# Patient Record
Sex: Male | Born: 1975 | Race: White | Hispanic: No | Marital: Married | Smoking: Never smoker
Health system: Southern US, Community
[De-identification: ages and names within clinical notes are randomized; demographics above are authoritative.]

## PROBLEM LIST (undated history)

## (undated) DIAGNOSIS — B001 Herpesviral vesicular dermatitis: Secondary | ICD-10-CM

## (undated) DIAGNOSIS — H9319 Tinnitus, unspecified ear: Secondary | ICD-10-CM

## (undated) DIAGNOSIS — I471 Supraventricular tachycardia, unspecified: Secondary | ICD-10-CM

## (undated) DIAGNOSIS — M40299 Other kyphosis, site unspecified: Secondary | ICD-10-CM

## (undated) DIAGNOSIS — J45909 Unspecified asthma, uncomplicated: Secondary | ICD-10-CM

## (undated) DIAGNOSIS — R011 Cardiac murmur, unspecified: Secondary | ICD-10-CM

## (undated) DIAGNOSIS — H4911 Fourth [trochlear] nerve palsy, right eye: Secondary | ICD-10-CM

## (undated) DIAGNOSIS — T783XXA Angioneurotic edema, initial encounter: Secondary | ICD-10-CM

## (undated) HISTORY — DX: Angioneurotic edema, initial encounter: T78.3XXA

## (undated) HISTORY — DX: Other kyphosis, site unspecified: M40.299

## (undated) HISTORY — DX: Tinnitus, unspecified ear: H93.19

## (undated) HISTORY — DX: Supraventricular tachycardia, unspecified: I47.10

## (undated) HISTORY — DX: Fourth (trochlear) nerve palsy, right eye: H49.11

## (undated) HISTORY — DX: Supraventricular tachycardia: I47.1

## (undated) HISTORY — DX: Herpesviral vesicular dermatitis: B00.1

## (undated) HISTORY — DX: Unspecified asthma, uncomplicated: J45.909

## (undated) HISTORY — DX: Cardiac murmur, unspecified: R01.1

## (undated) HISTORY — PX: MOHS SURGERY: SUR867

---

## 1999-07-17 ENCOUNTER — Encounter: Admission: RE | Admit: 1999-07-17 | Discharge: 1999-08-16 | Payer: Self-pay | Admitting: Family Medicine

## 2004-05-23 ENCOUNTER — Ambulatory Visit: Payer: Self-pay | Admitting: Family Medicine

## 2004-05-26 ENCOUNTER — Ambulatory Visit: Payer: Self-pay | Admitting: Family Medicine

## 2006-04-29 ENCOUNTER — Ambulatory Visit: Payer: Self-pay | Admitting: Family Medicine

## 2006-08-28 DIAGNOSIS — J45909 Unspecified asthma, uncomplicated: Secondary | ICD-10-CM | POA: Insufficient documentation

## 2007-05-06 ENCOUNTER — Ambulatory Visit: Payer: Self-pay | Admitting: Family Medicine

## 2007-06-09 ENCOUNTER — Ambulatory Visit: Payer: Self-pay | Admitting: Family Medicine

## 2007-12-03 ENCOUNTER — Ambulatory Visit: Payer: Self-pay | Admitting: Family Medicine

## 2007-12-03 DIAGNOSIS — M40299 Other kyphosis, site unspecified: Secondary | ICD-10-CM

## 2007-12-03 DIAGNOSIS — M40204 Unspecified kyphosis, thoracic region: Secondary | ICD-10-CM | POA: Insufficient documentation

## 2007-12-03 LAB — CONVERTED CEMR LAB
ALT: 21 units/L (ref 0–53)
AST: 21 units/L (ref 0–37)
Albumin: 4.5 g/dL (ref 3.5–5.2)
BUN: 11 mg/dL (ref 6–23)
Basophils Absolute: 0 10*3/uL (ref 0.0–0.1)
Basophils Relative: 0.2 % (ref 0.0–1.0)
CO2: 29 meq/L (ref 19–32)
Calcium: 9.6 mg/dL (ref 8.4–10.5)
Chloride: 109 meq/L (ref 96–112)
Creatinine, Ser: 0.8 mg/dL (ref 0.4–1.5)
Eosinophils Relative: 3.5 % (ref 0.0–5.0)
Glucose, Bld: 92 mg/dL (ref 70–99)
Hemoglobin: 16.6 g/dL (ref 13.0–17.0)
Lymphocytes Relative: 36.1 % (ref 12.0–46.0)
MCHC: 34.2 g/dL (ref 30.0–36.0)
Neutro Abs: 2 10*3/uL (ref 1.4–7.7)
RBC: 5.38 M/uL (ref 4.22–5.81)
TSH: 1.05 microintl units/mL (ref 0.35–5.50)
Total Bilirubin: 1.3 mg/dL — ABNORMAL HIGH (ref 0.3–1.2)
Total Protein: 7 g/dL (ref 6.0–8.3)
WBC: 3.9 10*3/uL — ABNORMAL LOW (ref 4.5–10.5)

## 2007-12-12 ENCOUNTER — Encounter: Payer: Self-pay | Admitting: Family Medicine

## 2008-10-23 ENCOUNTER — Ambulatory Visit: Payer: Self-pay | Admitting: Family Medicine

## 2008-10-26 ENCOUNTER — Ambulatory Visit: Payer: Self-pay | Admitting: Family Medicine

## 2008-12-03 ENCOUNTER — Ambulatory Visit: Payer: Self-pay | Admitting: Family Medicine

## 2008-12-03 LAB — CONVERTED CEMR LAB
ALT: 17 units/L (ref 0–53)
AST: 17 units/L (ref 0–37)
Albumin: 4.1 g/dL (ref 3.5–5.2)
BUN: 11 mg/dL (ref 6–23)
Basophils Absolute: 0 10*3/uL (ref 0.0–0.1)
CO2: 30 meq/L (ref 19–32)
Chloride: 110 meq/L (ref 96–112)
Cholesterol: 159 mg/dL (ref 0–200)
GFR calc non Af Amer: 137.96 mL/min (ref >60)
Glucose, Bld: 76 mg/dL (ref 70–99)
HCT: 46.2 % (ref 39.0–52.0)
Hemoglobin: 16 g/dL (ref 13.0–17.0)
Lymphs Abs: 1.6 10*3/uL (ref 0.7–4.0)
MCV: 89 fL (ref 78.0–100.0)
Monocytes Absolute: 0.5 10*3/uL (ref 0.1–1.0)
Monocytes Relative: 10.6 % (ref 3.0–12.0)
Neutro Abs: 2.1 10*3/uL (ref 1.4–7.7)
Platelets: 182 10*3/uL (ref 150.0–400.0)
Potassium: 4 meq/L (ref 3.5–5.1)
RDW: 12.5 % (ref 11.5–14.6)
Sodium: 145 meq/L (ref 135–145)
TSH: 1.01 microintl units/mL (ref 0.35–5.50)
Total Bilirubin: 0.8 mg/dL (ref 0.3–1.2)

## 2008-12-09 ENCOUNTER — Ambulatory Visit: Payer: Self-pay | Admitting: Family Medicine

## 2009-01-17 ENCOUNTER — Ambulatory Visit: Payer: Self-pay | Admitting: Family Medicine

## 2009-01-18 ENCOUNTER — Encounter (INDEPENDENT_AMBULATORY_CARE_PROVIDER_SITE_OTHER): Payer: Self-pay | Admitting: *Deleted

## 2009-01-18 LAB — CONVERTED CEMR LAB: Herpes Simplex Vrs I&II-IgM Ab (EIA): 0.27

## 2009-03-10 ENCOUNTER — Telehealth: Payer: Self-pay | Admitting: Family Medicine

## 2009-03-28 ENCOUNTER — Ambulatory Visit: Payer: Self-pay | Admitting: Family Medicine

## 2009-04-13 ENCOUNTER — Encounter: Payer: Self-pay | Admitting: Family Medicine

## 2010-01-05 ENCOUNTER — Encounter (INDEPENDENT_AMBULATORY_CARE_PROVIDER_SITE_OTHER): Payer: Self-pay | Admitting: *Deleted

## 2010-02-02 ENCOUNTER — Encounter: Payer: Self-pay | Admitting: Family Medicine

## 2010-02-02 ENCOUNTER — Ambulatory Visit: Payer: Self-pay | Admitting: Internal Medicine

## 2010-02-02 DIAGNOSIS — D2920 Benign neoplasm of unspecified testis: Secondary | ICD-10-CM | POA: Insufficient documentation

## 2010-02-02 DIAGNOSIS — D239 Other benign neoplasm of skin, unspecified: Secondary | ICD-10-CM | POA: Insufficient documentation

## 2010-03-06 ENCOUNTER — Encounter: Payer: Self-pay | Admitting: Family Medicine

## 2010-03-06 ENCOUNTER — Ambulatory Visit: Payer: Self-pay | Admitting: Internal Medicine

## 2010-04-25 ENCOUNTER — Ambulatory Visit: Payer: Self-pay | Admitting: Internal Medicine

## 2010-04-25 DIAGNOSIS — H9319 Tinnitus, unspecified ear: Secondary | ICD-10-CM | POA: Insufficient documentation

## 2010-07-06 NOTE — Assessment & Plan Note (Signed)
Summary: Benjamin Norman   Vital Signs:  Patient profile:   35 year old male Height:      70.50 inches Weight:      179.25 pounds BMI:     25.45 Temp:     98.6 degrees F oral Pulse rate:   64 / minute Pulse rhythm:   regular BP sitting:   126 / 80  (left arm) Cuff size:   large  Vitals Entered By: Selena Batten Dance CMA (AAMA) (February 02, 2010 8:32 AM) [name]   History of Present Illness: CC: CPE and issues  1. mole - per wife changing, dark center getting larger.  present for long time.  No bleeding, itching, pain.  Flat.  pt doesn't feel it.  2. left testicle - told just gland.  Has been there for >1 year.    preventative - UTD tetanus.  will check with wife about flu today.  Preventive Screening-Counseling & Management  Alcohol-Tobacco     Alcohol drinks/day: <1     Smoking Status: never  Caffeine-Diet-Exercise     Caffeine use/day: 1 can soda/2 days      Drug Use:  never.        Blood Transfusions:  no.    Current Medications (verified): 1)  Claritin 10 Mg Tabs (Loratadine) .Marland Kitchen.. 1 Daily By Mouth  Allergies: 1)  ! * Theodur  Past History:  Past Medical History: Asthma as child last inhaler use 99 swelling of lips intermittently, fever blisters  Social History: No smoking, EtOH, rec drugs Marital Status: Married Children: 1 DAUGHER (Benjamin Norman)  6 yoa, 1 SON (Benjamin Norman)  Youth Education officer, environmental at [employer], Occupation: Nature conservation officer M-F Regular exercise-yes Caffeine use/day:  1 can soda/2 days Blood Transfusions:  no Drug Use:  never  Review of Systems       The patient complains of hoarseness and suspicious skin lesions.  The patient denies anorexia, fever, weight loss, weight gain, vision loss, decreased hearing, chest pain, syncope, dyspnea on exertion, peripheral edema, prolonged cough, headaches, hemoptysis, abdominal pain, melena, hematochezia, severe indigestion/heartburn, hematuria, incontinence, transient blindness, difficulty walking, and depression.         NO  N/V/D/C  Physical Exam  General:  Well-developed,well-nourished,in no acute distress; alert,appropriate and cooperative throughout examination Head:  Normocephalic and atraumatic without obvious abnormalities. No apparent alopecia or balding. Eyes:  Conjunctiva clear bilaterally.  Ears:  External ear exam shows no significant lesions or deformities.  Otoscopic examination reveals clear canals, tympanic membranes are intact bilaterally without bulging, retraction, inflammation or discharge. Hearing is grossly normal bilaterally. Nose:  External nasal examination shows no deformity or inflammation. Nasal mucosa are pink and moist without lesions or exudates. Mouth:  Oral mucosa and oropharynx without lesions or exudates.  Teeth in good repair. Neck:  No deformities, masses, or tenderness noted. Lungs:  Normal respiratory effort, chest expands symmetrically. Lungs are clear to auscultation, no crackles or wheezes. Heart:  Normal rate and regular rhythm. S1 and S2 normal without gallop, murmur, click, rub or other extra sounds. Abdomen:  Bowel sounds positive,abdomen soft and non-tender without masses, organomegaly or hernias noted. Genitalia:  Testes bilaterally descended without nodularity, tenderness or masses. No scrotal masses or lesions except mild left varicocele. No penis lesions or urethral discharge.  + small benign-feeling calcification on surface of left testis Msk:  No deformity or scoliosis noted of thoracic or lumbar spine.   Pulses:  2+ rad pulses Extremities:  No clubbing, cyanosis, edema, or deformity noted with normal full  range of motion of all joints.   Skin:  left post shoulder with  ~1cm fleshy colored dome shaped mole but central area of irregular hyperpigmentation. left back medial to scapula  ~35mm mole with irregular border, different shades of brown left upper back with similar mole lateral to scapula  ~27mm in size. Psych:  Cognition and judgment appear intact. Alert and  cooperative with normal attention span and concentration. No apparent delusions, illusions, hallucinations   Impression & Recommendations:  Problem # 1:  HEALTH MAINTENANCE EXAM (ICD-V70.0)  Reviewed preventive care protocols, scheduled due services, and updated immunizations.  UTD tetanus.  declines flu today.    Problem # 2:  NEVUS, ATYPICAL (ICD-216.9)  removed x 2.  first per pt changing, 2nd with irregular borders.  sent to path.  Orders: Shave Skin Lesion 0.6-1.0 cm/trunk/arm/leg (11301)  Problem # 3:  HYPERCHOLESTEROLEMIA (ICD-272.0) actually well controlled will remove from problem list, rpt 2015. 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  rec continue monitoring, if changing will need [place].  Complete Medication List: 1)  Claritin 10 Mg Tabs (Loratadine) .Marland Kitchen.. 1 daily by mouth  Patient Instructions: 1)  Pleasure to meet you today. 2)  We will call you with results of biopys - sent to pathology.  If you haven't heard from [place] in 2 weeks, please call us. 3)  Call clinic with questions. Return in 1 year for CPE.  Current Allergies (reviewed today): ! * THEODUR   Procedure Note Last Tetanus: Tdap (06/09/2007)  Mole Biopsy/Removal:  Procedure # 1: shave biopsy    Size (in cm): 1.0 x 1.0    Region: posterior    Location: back-upper-left    Comment: posterior shoulder    Anesthesia: 1% lidocaine w/epinephrine  Procedure # 2: shave biopsy    Size (in cm): 0.6 x 0.8    Region: posterior    Location: back-mid-left    Comment: medial to scapula    Anesthesia: 1% lidocaine w/epinephrine  Additional Instructions: IC obtained and in chart.  site prepped with EtOH, anesthesia achieved, bleeding stopped with silver nitrate.  pt tolerated procedure well.   Prevention  & Chronic Care Immunizations   Influenza vaccine: Fluvax 3+  (04/01/2009)   Influenza vaccine deferral: Refused  (02/02/2010)    Tetanus booster: 06/09/2007: Tdap   Tetanus booster due: 06/08/2017    Pneumococcal vaccine: Not documented  Other Screening   Smoking status: never  (02/02/2010)

## 2010-07-06 NOTE — Miscellaneous (Signed)
Summary: Consent to Special Procedure  Consent to Special Procedure   Imported By: Lanelle Bal 03/13/2010 09:28:09  _____________________________________________________________________  External Attachment:    Type:   Image     Comment:   External Document

## 2010-07-06 NOTE — Miscellaneous (Signed)
Summary: Consent to Special Procedure  Consent to Special Procedure   Imported By: Lanelle Bal 02/10/2010 13:50:11  _____________________________________________________________________  External Attachment:    Type:   Image     Comment:   External Document

## 2010-07-06 NOTE — Assessment & Plan Note (Signed)
Summary: Back pain , headaches /lsf   Vital Signs:  Patient profile:   35 year old male Height:      70.50 inches Weight:      186.25 pounds BMI:     26.44 Temp:     98.8 degrees F oral Pulse rate:   84 / minute Pulse rhythm:   regular BP sitting:   120 / 82  (left arm) Cuff size:   large  Vitals Entered By: Selena Batten Dance CMA Duncan Dull) (April 25, 2010 4:18 PM) CC: ear is ringing, headaches, back pain   History of Present Illness: CC: back pain, ringing ears  yesterday went to get out of chair at work, felt sharp stabbing backache.  Today when awoke had pain all over.  Worse with looking down - feels pull in back when looking down.  Pain located mid back.    No fevers/chills, no injury to back recently.  No bowel or bladder accidents.  No saddle anesthesia.  No shooting pain down legs.  + paresthesias R foot yesterday x a few minutes not anymore.  2wk h/o R ear ringing, more frequent headaches.  Start as sinus headache then turn into migraine.  Taking mucinex/pseudophed.  h/o allergies, h/o sinus infections in past.  currently on claritin, has been on for 1 1/2 years.  previoulsy on allegra.  No hearing changes or draining out of ears.  + using qtip in ears.  No dizziness, vision changes.  + mild congestion/RN.  No sick contacts at home.  Current Medications (verified): 1)  Claritin 10 Mg Tabs (Loratadine) .Marland Kitchen.. 1 Daily By Mouth  Allergies: 1)  ! * Theodur  Past History:  Past Medical History: Last updated: 02/02/2010 Asthma as child last inhaler use 99 swelling of lips intermittently, fever blisters  Social History: Last updated: 02/02/2010 No smoking, EtOH, rec drugs Marital Status: Married Children: 1 DAUGHER (Faith)  6 yoa, 1 SON [name])  Youth Education officer, environmental at [employer], Occupation: Nature conservation officer M-F Regular exercise-yes  Review of Systems       per HPI  Physical Exam  General:  Well-developed,well-nourished,in no acute distress; alert,appropriate and cooperative  throughout examination Head:  Normocephalic and atraumatic without obvious abnormalities. No apparent alopecia or balding.  NT sinuses Eyes:  Conjunctiva clear bilaterally. PERRLA, EOMI Ears:  some fluid behind TMs Nose:  congested Mouth:  Oral mucosa and oropharynx without lesions or exudates.  Teeth in good repair. Neck:  No deformities, masses, or tenderness noted.  FROM Lungs:  Normal respiratory effort, chest expands symmetrically. Lungs are clear to auscultation, no crackles or wheezes. Heart:  Normal rate and regular rhythm. S1 and S2 normal without gallop, murmur, click, rub or other extra sounds. Msk:  No deformity or scoliosis noted of thoracic or lumbar spine.  no midline tenderness.  no paraspinous mm tenderness/tightness.  Negative SLR test bilaterally.  FROM at spine.  + pain with leaning backward onto exam table. Pulses:  2+ rad pulses Extremities:  no pedal edema Neurologic:  sensation/sterngth grossly intact   Impression & Recommendations:  Problem # 1:  BACK PAIN (ICD-724.5) no red flags.  likely muscle spasm, treat with flexeril, ice.  rec NSAID as well.  to return if worsening for further eval.  if not better in 1-2 wks, to call for PT.  h/o thoracic kyphosis  His updated medication list for this problem includes:    Flexeril 5 Mg Tabs (Cyclobenzaprine hcl) ..... One by mouth two times a day as needed muscle spasm  Problem # 2:  TINNITUS, RIGHT (ICD-388.30) normal exam today.  reassured.  ? allergies.  consider changing to allegra.  also consider nasal saline spray.  if residual, return for further evaluation.  Complete Medication List: 1)  Claritin 10 Mg Tabs (Loratadine) .Marland Kitchen.. 1 daily by mouth 2)  Flexeril 5 Mg Tabs (Cyclobenzaprine hcl) .... One by mouth two times a day as needed muscle spasm  Patient Instructions: 1)  muscle spasm - treat with anti inflammatory and muscle relaxant (can make you sleepy so watch out.) 2)  ringing in ears - could be allergies -  watch for now.  consider changing antihistamine to allegra or zyrtec. 3)  Call clinic with questions.  Good to see you today. 4)  let us know if not helping, may send you to PT. 5)  Return sooner of worsening. Prescriptions: FLEXERIL 5 MG TABS (CYCLOBENZAPRINE HCL) one by mouth two times a day as needed muscle spasm  #30 x 0   Entered and Authorized by:   [name]  MD   Signed by:   [name]  MD on 04/25/2010   Method used:   Electronically to        [facility] (retail)       [address]       [place], [place]  [address]       Ph: [phone]       Fax: [phone]   RxID:   [id number]    Orders Added: 1)  Est. Patient Level III [23762]    Current Allergies (reviewed today): ! * THEODUR

## 2010-07-06 NOTE — Assessment & Plan Note (Signed)
Summary: Return for 2nd bx(check 3rd place)//kad   Vital Signs:  Patient profile:   35 year old male Weight:      183 pounds Temp:     98.2 degrees F oral Pulse rate:   84 / minute Pulse rhythm:   regular BP sitting:   118 / 80  (left arm) Cuff size:   large  Vitals Entered By: Selena Batten Dance CMA Duncan Dull) (March 06, 2010 8:28 AM) CC: Bx   History of Present Illness: CC: f/u biopsy, rebiopsy site  Last visit 2 moles biopsied.  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    returns today for rpt biopsy of 2nd mole and to check third one.  Allergies: 1)  ! * Theodur  Past History:  Past Medical History: Last updated: 02/02/2010 Asthma as child last inhaler use 99 swelling of lips intermittently, fever blisters  Social History: Last updated: 02/02/2010 No smoking, EtOH, rec drugs Marital Status: Married Children: 1 DAUGHER (Faith)  6 yoa, 1 SON [name])  Youth Education officer, environmental at [employer], Occupation: Nature conservation officer M-F Regular exercise-yes  Family History: Reviewed history from 12/09/2008 and no changes required. Father: A 56 Mother: A 54 HTN Chol Brother A 37   Review of Systems       per HPI  Physical Exam  General:  Well-developed,well-nourished,in no acute distress; alert,appropriate and cooperative throughout examination Skin:  Left post shoulder - healing scar L upper back medial to shoulder blade - healing scar L upper back lateral to shoulder blade - 7mm mole with irreg border, different shades of brown. Additional Exam:  IC obtained and in chart - re-biopsy of mole L medial to scapula.  anesthesia with lido w/ epi, hemostasis achieved with silver nitrate.  dressed w/ abx ointment and bandaid.  pt tolerated procedure well.   Impression & Recommendations:  Problem # 1:  NEVUS, ATYPICAL (ICD-216.9) nevus with mild atypia.  re-biopsy given areas  extended to one edge.  discussed process with patient as well as path findings.  rec pt see dermatologist for skin exam in next few years.  Complete Medication List: 1)  Claritin 10 Mg Tabs (Loratadine) .Marland Kitchen.. 1 daily by mouth  Current Allergies (reviewed today): ! * THEODUR  Appended Document: Return for 2nd bx(check 3rd place)//kad given 3rd spot looks like 2nd spot, and 2nd mole removed returned only with mild atypia, recommended watchful waiting for 3rd spot lateral to L scapula, to return if mole changing any.

## 2010-07-06 NOTE — Letter (Signed)
Summary: Nadara Eaton letter  [place] [facility]  [address] [place], [place] [address]   Phone: [phone]  Fax: [phone]       01/05/2010 MRN: [id number]  CALIN FANTROY [address] [place], [place]  [address]  Dear Mr. [facility] [place] [place] announce the retirement of Arta Silence, M.D., from full-time practice at the [facility] office effective December 01, 2009 and his plans of returning part-time.  It is important to Dr. Hetty Ely and to our practice that you understand that [facility] has seven physicians in our office for your health care needs.  We will continue to offer the same exceptional care that you have today.    Dr. Hetty Ely has spoken to many of you about his plans for retirement and returning part-time in the fall.   We will continue to work with you through the transition to schedule appointments for you in the office and meet the high standards that [place] is committed to.   Again, it is with great pleasure that we share the news that Dr. Hetty Ely will return to [facility] in October of 2011 with a reduced schedule.    If you have any questions, or would like to request an appointment with one of our physicians, please call us at [phone] and press the option for Scheduling an appointment.  We take pleasure in providing you with excellent patient care and look forward to seeing you at your next office visit.  Our [facility] Physicians are:  Tillman Abide, M.D. Laurita Quint, M.D. Roxy Manns, M.D. Kerby Nora, M.D. [name], M.D. Ruthe Mannan, M.D. We proudly welcomed Raechel Ache, M.D. and [name], M.D. to the practice in July/August 2011.  Sincerely,  [facility]

## 2011-02-08 ENCOUNTER — Encounter: Payer: Self-pay | Admitting: Family Medicine

## 2011-02-09 ENCOUNTER — Ambulatory Visit (INDEPENDENT_AMBULATORY_CARE_PROVIDER_SITE_OTHER): Payer: PRIVATE HEALTH INSURANCE | Admitting: Family Medicine

## 2011-02-09 ENCOUNTER — Encounter: Payer: Self-pay | Admitting: Family Medicine

## 2011-02-09 ENCOUNTER — Other Ambulatory Visit: Payer: Self-pay | Admitting: Family Medicine

## 2011-02-09 VITALS — BP 110/70 | HR 80 | Temp 98.4°F | Ht 71.0 in | Wt 189.2 lb

## 2011-02-09 DIAGNOSIS — Z0000 Encounter for general adult medical examination without abnormal findings: Secondary | ICD-10-CM | POA: Insufficient documentation

## 2011-02-09 DIAGNOSIS — Z0001 Encounter for general adult medical examination with abnormal findings: Secondary | ICD-10-CM | POA: Insufficient documentation

## 2011-02-09 DIAGNOSIS — R5383 Other fatigue: Secondary | ICD-10-CM | POA: Insufficient documentation

## 2011-02-09 DIAGNOSIS — R5381 Other malaise: Secondary | ICD-10-CM

## 2011-02-09 LAB — CBC WITH DIFFERENTIAL/PLATELET
Basophils Absolute: 0 10*3/uL (ref 0.0–0.1)
Basophils Relative: 0.5 % (ref 0.0–3.0)
Eosinophils Absolute: 0.2 10*3/uL (ref 0.0–0.7)
Eosinophils Relative: 3.4 % (ref 0.0–5.0)
HCT: 51.6 % (ref 39.0–52.0)
Hemoglobin: 17.2 g/dL — ABNORMAL HIGH (ref 13.0–17.0)
Lymphocytes Relative: 27.2 % (ref 12.0–46.0)
Lymphs Abs: 1.5 10*3/uL (ref 0.7–4.0)
MCHC: 33.4 g/dL (ref 30.0–36.0)
MCV: 89.8 fl (ref 78.0–100.0)
Monocytes Absolute: 0.4 10*3/uL (ref 0.1–1.0)
Monocytes Relative: 7.2 % (ref 3.0–12.0)
Neutro Abs: 3.5 10*3/uL (ref 1.4–7.7)
Neutrophils Relative %: 61.7 % (ref 43.0–77.0)
Platelets: 180 10*3/uL (ref 150.0–400.0)
RBC: 5.74 Mil/uL (ref 4.22–5.81)
RDW: 12.9 % (ref 11.5–14.6)
WBC: 5.6 10*3/uL (ref 4.5–10.5)

## 2011-02-09 LAB — COMPREHENSIVE METABOLIC PANEL
Albumin: 4.7 g/dL (ref 3.5–5.2)
BUN: 8 mg/dL (ref 6–23)
CO2: 29 mEq/L (ref 19–32)
GFR: 131.82 mL/min (ref >60.00)
Glucose, Bld: 91 mg/dL (ref 70–99)
Potassium: 4.5 mEq/L (ref 3.5–5.1)
Sodium: 141 mEq/L (ref 135–145)
Total Bilirubin: 1.3 mg/dL — ABNORMAL HIGH (ref 0.3–1.2)
Total Protein: 7.8 g/dL (ref 6.0–8.3)

## 2011-02-09 LAB — TSH: TSH: 0.81 u[IU]/mL (ref 0.35–5.50)

## 2011-02-09 LAB — VITAMIN B12: Vitamin B-12: 362 pg/mL (ref 211–911)

## 2011-02-09 NOTE — Progress Notes (Signed)
  Subjective:    Patient ID: Benjamin Norman, male    DOB: March 01, 1976, 35 y.o.   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  Controls with steroid cream as well as aquaphor.  Increased fatigue recently for last several months.  Started increasing activity and taking vitamin pill, this has helped.  Currently in school.  No dizziness, SOB.  Has had skin biopsies done in past, 12/2009 with dysplastic nevus with melanocytic atypia.  Preventative: Td 2009.  Review of Systems  Constitutional: Negative for fever, chills, activity change, appetite change, fatigue and unexpected weight change.  HENT: Negative for hearing loss and neck pain.   Eyes: Negative for visual disturbance.  Respiratory: Negative for cough, chest tightness, shortness of breath and wheezing.   Cardiovascular: Negative for chest pain, palpitations and leg swelling.  Gastrointestinal: Negative for nausea, vomiting, abdominal pain, diarrhea, constipation, blood in stool and abdominal distention.  Genitourinary: Negative for hematuria and difficulty urinating.  Musculoskeletal: Negative for myalgias and arthralgias.  Skin: Negative for rash.  Neurological: Negative for dizziness, seizures, syncope and headaches.  Hematological: Does not bruise/bleed easily.  Psychiatric/Behavioral: Negative for dysphoric mood. The patient is not nervous/anxious.        Objective:   Physical Exam  Nursing note and vitals reviewed. Constitutional: He is oriented to person, place, and time. He appears well-developed and well-nourished. No distress.  HENT:  Head: Normocephalic and atraumatic.  Right Ear: External ear normal.  Left Ear: External ear normal.  Nose: Nose normal.  Mouth/Throat: Oropharynx is clear and moist.  Eyes: Conjunctivae and EOM are normal. Pupils are equal, round, and reactive to light.  Neck: Normal range of motion. Neck supple. No thyromegaly present.  Cardiovascular:  Normal rate, regular rhythm, normal heart sounds and intact distal pulses.   No murmur heard. Pulses:      Radial pulses are 2+ on the right side, and 2+ on the left side.  Pulmonary/Chest: Effort normal and breath sounds normal. No respiratory distress. He has no wheezes. He has no rales.  Abdominal: Soft. Bowel sounds are normal. He exhibits no distension and no mass. There is no tenderness. There is no rebound and no guarding.  Musculoskeletal: Normal range of motion.  Lymphadenopathy:    He has no cervical adenopathy.  Neurological: He is alert and oriented to person, place, and time.       CN grossly intact, station and gait intact  Skin: Skin is warm and dry. No rash noted.       Multiple nevi throughout skin  Psychiatric: He has a normal mood and affect. His behavior is normal. Judgment and thought content normal.          Assessment & Plan:

## 2011-02-09 NOTE — Patient Instructions (Addendum)
Good to see you today, things are looking well. Blood work today. We will call you with blood work results. Return as needed or in 1-2 years for repeat physical. Look into dermatology for skin check.

## 2011-02-09 NOTE — Assessment & Plan Note (Signed)
Preventative protocols reviewed and updated unless pt declined. Pt declined flu shot. Pt interested in derm eval for skin check.  Recommended set this up.

## 2011-02-09 NOTE — Assessment & Plan Note (Signed)
Sounds like due to lack of activity/exercise as improving with recent increased exercise. Check blood work to r/o other causes of fatigue.

## 2011-05-15 ENCOUNTER — Telehealth: Payer: Self-pay | Admitting: *Deleted

## 2011-05-15 NOTE — Telephone Encounter (Signed)
Spoke to patient's wife and was informed that patient has not had any recent head injury. Patient's wife was informed as instructed.

## 2011-05-15 NOTE — Telephone Encounter (Signed)
Patient says he walked for exercise at lunch time from work and suddenly a flourescent colored yellow, watery discharge starting pouring from his nose, actually accumulating on the sidewalk.  It then got better but now any time he bends over, it starts again.  He says he doesn't feel good and thinks he may be starting a sinus infection.  Does he need an appt or give it longer?  He has had a lot of sinus infections in the past but never had this to happen before, especially the odd-colored fluid.

## 2011-05-15 NOTE — Telephone Encounter (Signed)
Any recent head injury?  If not, would give more time.  May come in for eval if not improving with time.

## 2011-05-25 NOTE — Telephone Encounter (Signed)
Opened in error

## 2011-08-10 ENCOUNTER — Other Ambulatory Visit (INDEPENDENT_AMBULATORY_CARE_PROVIDER_SITE_OTHER): Payer: PRIVATE HEALTH INSURANCE

## 2011-08-10 DIAGNOSIS — R7402 Elevation of levels of lactic acid dehydrogenase (LDH): Secondary | ICD-10-CM

## 2011-08-10 LAB — HEPATIC FUNCTION PANEL
Alkaline Phosphatase: 51 U/L (ref 39–117)
Bilirubin, Direct: 0.1 mg/dL (ref 0.0–0.3)
Total Bilirubin: 0.8 mg/dL (ref 0.3–1.2)

## 2012-04-22 ENCOUNTER — Other Ambulatory Visit: Payer: Self-pay | Admitting: Dermatology

## 2012-08-01 ENCOUNTER — Other Ambulatory Visit: Payer: Self-pay | Admitting: Family Medicine

## 2012-08-05 ENCOUNTER — Other Ambulatory Visit: Payer: PRIVATE HEALTH INSURANCE

## 2012-08-07 ENCOUNTER — Other Ambulatory Visit (INDEPENDENT_AMBULATORY_CARE_PROVIDER_SITE_OTHER): Payer: BC Managed Care – PPO

## 2012-08-07 LAB — COMPREHENSIVE METABOLIC PANEL
Alkaline Phosphatase: 55 U/L (ref 39–117)
Glucose, Bld: 82 mg/dL (ref 70–99)
Sodium: 139 mEq/L (ref 135–145)
Total Bilirubin: 1.9 mg/dL — ABNORMAL HIGH (ref 0.3–1.2)
Total Protein: 7.1 g/dL (ref 6.0–8.3)

## 2012-08-07 LAB — TSH: TSH: 1.32 u[IU]/mL (ref 0.35–5.50)

## 2012-08-07 NOTE — Addendum Note (Signed)
Addended by: Alvina Chou on: 08/07/2012 08:25 AM   Modules accepted: Orders

## 2012-08-12 ENCOUNTER — Ambulatory Visit (INDEPENDENT_AMBULATORY_CARE_PROVIDER_SITE_OTHER): Payer: BC Managed Care – PPO | Admitting: Family Medicine

## 2012-08-12 ENCOUNTER — Encounter: Payer: Self-pay | Admitting: Family Medicine

## 2012-08-12 VITALS — BP 118/74 | HR 68 | Temp 98.3°F | Ht 70.0 in

## 2012-08-12 NOTE — Progress Notes (Signed)
Subjective:    Patient ID: Benjamin Norman, male    DOB: Sep 03, 1975, 37 y.o.   MRN: [id number]  HPI CC: CPE  No concerns today Allergic rhinitis - intermittently alternates claritin with allegra.  Sees dermatologist yearly.  H/o atypical nevi.  Trying to eat healthier.  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  Past histories reviewed and updated if relevant as below. Patient Active Problem List  Diagnosis  . NEVUS, ATYPICAL  . TINNITUS, RIGHT  . ASTHMA  . THORACIC KYPHOSIS  . Fatigue  . Healthcare maintenance  . Transaminitis   Past Medical History  Diagnosis Date  . Childhood asthma   . 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 Fever blister   . Other kyphosis (acquired)     thoracic  . Unspecified tinnitus    History reviewed. No pertinent past surgical history. History  Substance Use Topics  . Smoking status: Never Smoker   . Smokeless tobacco: Never Used  . Alcohol Use: No   Family History  Problem Relation Age of Onset  . Hypertension Mother   . Hyperlipidemia Mother   . Healthy Father   . Healthy Brother   . Coronary artery disease Paternal Grandfather   . Alcohol abuse Paternal Grandfather   . Cancer Neg Hx   . Stroke Neg Hx   . Diabetes Neg Hx    Allergies  Allergen Reactions  . Theophyllines Nausea And Vomiting    As teen   Current Outpatient Prescriptions on File Prior to Visit  Medication Sig Dispense Refill  . mineral oil-hydrophilic petrolatum (AQUAPHOR) ointment Apply 1 application topically as  needed.         No current facility-administered medications on file prior to visit.    Review of Systems  Constitutional: Negative for fever, chills, activity change, appetite change, fatigue and unexpected weight change.  HENT: Negative for hearing loss and neck pain.   Eyes: Negative for visual disturbance.  Respiratory: Negative for cough, chest tightness, shortness of breath and wheezing.   Cardiovascular: Negative for chest pain, palpitations and leg swelling.  Gastrointestinal: Negative for nausea, vomiting, abdominal pain, diarrhea, constipation, blood in stool and abdominal distention.  Genitourinary: Negative for hematuria and difficulty urinating.  Musculoskeletal: Negative for myalgias and arthralgias.  Skin: Negative for rash.  Neurological: Positive for headaches. Negative for dizziness, seizures and syncope.  Hematological: Does not bruise/bleed easily.  Psychiatric/Behavioral: Negative for dysphoric mood. The patient is not nervous/anxious.        Objective:   Physical Exam  Nursing note and vitals reviewed. Constitutional: He is oriented to person, place, and time. He appears well-developed and well-nourished. No distress.  HENT:  Head: Normocephalic and atraumatic.  Right Ear: Hearing, tympanic membrane, external ear and ear canal normal.  Left Ear: Hearing, tympanic membrane, external ear and ear canal normal.  Nose: Nose normal.  Mouth/Throat: Oropharynx is clear and moist. No oropharyngeal exudate.  Eyes: Conjunctivae and EOM are normal. Pupils are equal, round, and reactive to light. No scleral icterus.  Neck: Normal range of motion.  Neck supple. No thyromegaly present.  Cardiovascular: Normal rate, regular rhythm, normal heart sounds and intact distal pulses.   No murmur heard. Pulses:      Radial pulses are 2+ on the right side, and 2+ on the left side.  Pulmonary/Chest: Effort normal and breath sounds normal. No respiratory distress. He has no wheezes. He  has no rales.  Abdominal: Soft. Bowel sounds are normal. He exhibits no distension and no mass. There is no tenderness. There is no rebound and no guarding.  Musculoskeletal: Normal range of motion. He exhibits no edema.  Lymphadenopathy:    He has no cervical adenopathy.  Neurological: He is alert and oriented to person, place, and time.  CN grossly intact, station and gait intact  Skin: Skin is warm and dry. No rash noted.  Psychiatric: He has a normal mood and affect. His behavior is normal. Judgment and thought content normal.       Assessment & Plan:

## 2012-08-12 NOTE — Assessment & Plan Note (Signed)
Preventative protocols reviewed and updated unless pt declined. Discussed healthy diet and lifestyle.  rtc 2 yrs for next CPE.

## 2012-08-12 NOTE — Patient Instructions (Signed)
Cholesterol levels were normal last check - you don't need rechecked this year. Good to see you today, call us with questions. Return as needed or in 2 years for next physical

## 2013-04-28 ENCOUNTER — Other Ambulatory Visit: Payer: Self-pay | Admitting: Dermatology

## 2013-05-25 ENCOUNTER — Ambulatory Visit (INDEPENDENT_AMBULATORY_CARE_PROVIDER_SITE_OTHER): Payer: BC Managed Care – PPO | Admitting: Family Medicine

## 2013-05-25 ENCOUNTER — Encounter: Payer: Self-pay | Admitting: Family Medicine

## 2013-05-25 VITALS — BP 134/90 | HR 93 | Temp 97.4°F | Ht 70.0 in | Wt 192.5 lb

## 2013-05-25 DIAGNOSIS — R1032 Left lower quadrant pain: Secondary | ICD-10-CM | POA: Insufficient documentation

## 2013-05-25 DIAGNOSIS — H4911 Fourth [trochlear] nerve palsy, right eye: Secondary | ICD-10-CM

## 2013-05-25 DIAGNOSIS — H4910 Fourth [trochlear] nerve palsy, unspecified eye: Secondary | ICD-10-CM

## 2013-05-25 DIAGNOSIS — R109 Unspecified abdominal pain: Secondary | ICD-10-CM

## 2013-05-25 LAB — POCT URINALYSIS DIPSTICK
Bilirubin, UA: NEGATIVE
Blood, UA: NEGATIVE
Glucose, UA: NEGATIVE
Leukocytes, UA: NEGATIVE
Nitrite, UA: NEGATIVE

## 2013-05-25 NOTE — Patient Instructions (Addendum)
Urine is looking fine but concentrated. I wonder if this is due to some constipation - treat with metamucil or citrucel once daily with a glass of water - do this daily for next 1-2 weeks to see if any improvement.  If not let me know. Exam overall looking normal. Good to see you today, call us with questions.

## 2013-05-25 NOTE — Progress Notes (Signed)
   Subjective:    Patient ID: Benjamin Norman, male    DOB: Aug 24, 1975, 37 y.o.   MRN: [id number]  HPI CC: L sided discomfort  Ongoing intermittent anterior L sided abdominal discomfort over last months, has had episodes where discomfort radiates to midline.  Describes ache vs pressure LLQ.   Denies increase in gas or bloating.  Has had less healthy diet since Halloween. Denies fevers/chills, diarrhea or constipation, nausea/vomiting.  Denies dysuria, urgency, frequency, hematuria. Occasional lower left back pain. Tends to have some constipation - but does go daily regularly.  Occasionally hard stools  So far has tried cutting down on peanuts and seeds. Also has backed off goody's powders.   Has cut back on coffee and soda.    Does wear iphone on left belt. Expecting new baby mid February.  Oh by the way - optometrist noticed R eye deviating up with left lateral gaze.  Pt asks about this.  No h/o head trauma.  Present since childhood.  Past Medical History  Diagnosis Date  . Childhood asthma   . 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 Fever blister   . Other kyphosis (acquired)     thoracic  . Unspecified tinnitus     Current Outpatient Prescriptions on File Prior to Visit  Medication Sig Dispense Refill  . fexofenadine (ALLEGRA) 180 MG tablet Take 180 mg by mouth daily.      Marland Kitchen loratadine (CLARITIN) 10 MG tablet Take 10 mg by mouth daily.      . mineral oil-hydrophilic petrolatum (AQUAPHOR) ointment Apply 1 application topically as needed.         No current facility-administered medications on file prior to visit.    Review of Systems Per HPI    Objective:   Physical Exam  Nursing note and vitals reviewed. Constitutional: He appears well-developed and well-nourished. No distress.  HENT:  Mouth/Throat: Oropharynx is clear and moist. No oropharyngeal exudate.  Eyes: Conjunctivae are normal. Pupils are equal, round, and reactive to light. No  scleral icterus. Right eye exhibits abnormal extraocular motion. Right eye exhibits no nystagmus. Left eye exhibits normal extraocular motion and no nystagmus.  R eye deviates superiorly with leftward gaze, resolves with looking down  Cardiovascular: Normal rate, regular rhythm, normal heart sounds and intact distal pulses.   No murmur heard. Pulmonary/Chest: Effort normal and breath sounds normal. No respiratory distress. He has no wheezes. He has no rales.  Abdominal: Soft. Normal appearance and bowel sounds are normal. He exhibits no distension and no mass. There is no hepatosplenomegaly. There is no tenderness. There is no rigidity, no rebound, no guarding, no CVA tenderness and negative Murphy's sign.  Possible small bilateral inguinal hernias  Musculoskeletal: He exhibits no edema.  No pain with int/ ext rotation at hips. No pain with abduction/adduction or forward flexion against resistance of L upper leg.       Assessment & Plan:

## 2013-05-25 NOTE — Assessment & Plan Note (Addendum)
Benign exam.  Doubt hernia, diverticulitis related or MSK.  ?constipation related so will recommend citrucel or metamucil daily for 1-2 wks to see if sxs resolve.   If not improved, update me for further treatment options. UA ok today.

## 2013-05-25 NOTE — Progress Notes (Signed)
Pre-visit discussion using our clinic review tool. No additional management support is needed unless otherwise documented below in the visit note.  

## 2013-05-26 ENCOUNTER — Encounter: Payer: Self-pay | Admitting: Family Medicine

## 2013-05-26 DIAGNOSIS — H4911 Fourth [trochlear] nerve palsy, right eye: Secondary | ICD-10-CM | POA: Insufficient documentation

## 2013-05-26 NOTE — Assessment & Plan Note (Signed)
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 Will refer to ophtho for further evaluation if needed.

## 2013-06-16 ENCOUNTER — Encounter: Payer: Self-pay | Admitting: Family Medicine

## 2013-08-12 ENCOUNTER — Encounter: Payer: Self-pay | Admitting: Internal Medicine

## 2013-08-12 ENCOUNTER — Ambulatory Visit (INDEPENDENT_AMBULATORY_CARE_PROVIDER_SITE_OTHER): Payer: BC Managed Care – PPO | Admitting: Internal Medicine

## 2013-08-12 VITALS — BP 130/84 | HR 82 | Temp 98.2°F | Wt 190.8 lb

## 2013-08-12 DIAGNOSIS — J020 Streptococcal pharyngitis: Secondary | ICD-10-CM

## 2013-08-12 DIAGNOSIS — Z20818 Contact with and (suspected) exposure to other bacterial communicable diseases: Secondary | ICD-10-CM

## 2013-08-12 DIAGNOSIS — Z2089 Contact with and (suspected) exposure to other communicable diseases: Secondary | ICD-10-CM

## 2013-08-12 LAB — POCT RAPID STREP A (OFFICE): RAPID STREP A SCREEN: POSITIVE — AB

## 2013-08-12 MED ORDER — AMOXICILLIN 875 MG PO TABS: 875.0000 mg | ORAL_TABLET | Freq: Two times a day (BID) | ORAL | Status: DC

## 2013-08-12 NOTE — Patient Instructions (Addendum)
Strep Throat  Strep throat is an infection of the throat caused by a bacteria named Streptococcus pyogenes. Your caregiver may call the infection streptococcal "tonsillitis" or "pharyngitis" depending on whether there are signs of inflammation in the tonsils or back of the throat. Strep throat is most common in children aged 38 15 years during the cold months of the year, but it can occur in people of any age during any season. This infection is spread from person to person (contagious) through coughing, sneezing, or other close contact.  SYMPTOMS   · Fever or chills.  · Painful, swollen, red tonsils or throat.  · Pain or difficulty when swallowing.  · White or yellow spots on the tonsils or throat.  · Swollen, tender lymph nodes or "glands" of the neck or under the jaw.  · Red rash all over the body (rare).  DIAGNOSIS   Many different infections can cause the same symptoms. A test must be done to confirm the diagnosis so the right treatment can be given. A "rapid strep test" can help your caregiver make the diagnosis in a few minutes. If this test is not available, a light swab of the infected area can be used for a throat culture test. If a throat culture test is done, results are usually available in a day or two.  TREATMENT   Strep throat is treated with antibiotic medicine.  HOME CARE INSTRUCTIONS   · Gargle with 1 tsp of salt in 1 cup of warm water, 3 4 times per day or as needed for comfort.  · Family members who also have a sore throat or fever should be tested for strep throat and treated with antibiotics if they have the strep infection.  · Make sure everyone in your household washes their hands well.  · Do not share food, drinking cups, or personal items that could cause the infection to spread to others.  · You may need to eat a soft food diet until your sore throat gets better.  · Drink enough water and fluids to keep your urine clear or pale yellow. This will help prevent dehydration.  · Get plenty of  rest.  · Stay home from school, daycare, or work until you have been on antibiotics for 24 hours.  · Only take over-the-counter or prescription medicines for pain, discomfort, or fever as directed by your caregiver.  · If antibiotics are prescribed, take them as directed. Finish them even if you start to feel better.  SEEK MEDICAL CARE IF:   · The glands in your neck continue to enlarge.  · You develop a rash, cough, or earache.  · You cough up green, yellow-brown, or bloody sputum.  · You have pain or discomfort not controlled by medicines.  · Your problems seem to be getting worse rather than better.  SEEK IMMEDIATE MEDICAL CARE IF:   · You develop any new symptoms such as vomiting, severe headache, stiff or painful neck, chest pain, shortness of breath, or trouble swallowing.  · You develop severe throat pain, drooling, or changes in your voice.  · You develop swelling of the neck, or the skin on the neck becomes red and tender.  · You have a fever.  · You develop signs of dehydration, such as fatigue, dry mouth, and decreased urination.  · You become increasingly sleepy, or you cannot wake up completely.  Document Released: 05/18/2000 Document Revised: 05/07/2012 Document Reviewed: 07/20/2010  [employer]® Patient Information ©2014 [employer].

## 2013-08-12 NOTE — Progress Notes (Signed)
Pre visit review using our clinic review tool, if applicable. No additional management support is needed unless otherwise documented below in the visit note. 

## 2013-08-12 NOTE — Progress Notes (Signed)
Subjective:    Patient ID: Benjamin Norman, male    DOB: 08/30/1975, 38 y.o.   MRN: [id number]  HPI  Pt presents to the clinic today with c/o sore throat. This started 2 days ago. He reports that it started out as a scratchy throat. He has taken Ibuprofen OTC with some relief. His wife and his son have both been diagnosed with strep throat.  Review of Systems      Past Medical History  Diagnosis Date  . Childhood asthma   . 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 Fever blister   . Other kyphosis (acquired)     thoracic  . Unspecified tinnitus   . Trochlear nerve palsy, right eye     congenital Information systems manager)    Current Outpatient Prescriptions  Medication Sig Dispense Refill  . fexofenadine (ALLEGRA) 180 MG tablet Take 180 mg by mouth daily.      . mineral oil-hydrophilic petrolatum (AQUAPHOR) ointment Apply 1 application topically as needed.        Marland Kitchen amoxicillin (AMOXIL) 875 MG tablet Take 1 tablet (875 mg total) by mouth 2 (two) times daily.  20 tablet  0  . loratadine (CLARITIN) 10 MG tablet Take 10 mg by mouth daily.       No current facility-administered medications for this visit.    Allergies  Allergen Reactions  . Theophyllines Nausea And Vomiting    As teen    Family History  Problem Relation Age of Onset  . Hypertension Mother   . Hyperlipidemia Mother   . Healthy Father   . Healthy Brother   . Coronary artery disease Paternal Grandfather   . Alcohol abuse Paternal Grandfather   . Cancer Neg Hx   . Stroke Neg Hx   . Diabetes Neg Hx     History   Social History  . Marital Status: Married    Spouse Name: N/A    Number of Children: 2  . Years of Education: N/A   Occupational History  . Librarian, academic   . Youth pastor    Social History Main Topics  . Smoking status: Never Smoker   . Smokeless tobacco: Never Used  . Alcohol Use: No  . Drug Use: No  . Sexual Activity: Not on file   Other Topics Concern  . 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  HEENT: Pt reports sore throat. Denies eye pain, eye redness, ear pain, ringing in the ears, wax buildup, runny nose, nasal congestion, bloody nose. Respiratory: Denies difficulty breathing, shortness of breath, cough or sputum production.     No other specific complaints in a complete review of systems (except as listed in HPI above).  Objective:   Physical Exam  BP 130/84  Pulse 82  Temp(Src) 98.2 F (36.8 C) (Oral)  Wt 190 lb 12 oz (86.524 kg)  SpO2 98% Wt Readings from Last 3 Encounters:  08/12/13 190 lb 12 oz (86.524 kg)  05/25/13 192 lb 8 oz (87.317 kg)  02/09/11 189 lb 4 oz (85.843 kg)    General: Appears his stated age, well developed, well nourished in NAD. Skin: Warm,  dry and intact. No rashes, lesions or ulcerations noted. HEENT: Head: normal shape and size; Eyes: sclera white, no icterus, conjunctiva pink, PERRLA and EOMs intact; Ears: Tm's gray and intact, normal light reflex; Nose: mucosa pink and moist, septum midline; Throat/Mouth: Teeth present, mucosa erythematous and moist, no exudate, lesions or ulcerations noted.  Cardiovascular: Normal rate and rhythm. S1,S2 noted.  No murmur, rubs or gallops noted. No JVD or BLE edema. No carotid bruits noted. Pulmonary/Chest: Normal effort and positive vesicular breath sounds. No respiratory distress. No wheezes, rales or ronchi noted.    BMET    Component Value Date/Time   NA 139 08/07/2012 0825   K 4.0 08/07/2012 0825   CL 104 08/07/2012 0825   CO2 26 08/07/2012 0825   GLUCOSE 82 08/07/2012 0825   BUN 11 08/07/2012 0825   CREATININE 0.8 08/07/2012 0825   CALCIUM 9.2 08/07/2012 0825   GFRNONAA  137.96 12/03/2008 0849   GFRAA 144 12/03/2007 0855    Lipid Panel     Component Value Date/Time   CHOL 159 12/03/2008 0849   TRIG 77.0 12/03/2008 0849   HDL 44.90 12/03/2008 0849   CHOLHDL 4 12/03/2008 0849   VLDL 15.4 12/03/2008 0849   LDLCALC 99 12/03/2008 0849    CBC    Component Value Date/Time   WBC 5.6 02/09/2011 0935   RBC 5.74 02/09/2011 0935   HGB 17.2* 02/09/2011 0935   HCT 51.6 02/09/2011 0935   PLT 180.0 02/09/2011 0935   MCV 89.8 02/09/2011 0935   MCHC 33.4 02/09/2011 0935   RDW 12.9 02/09/2011 0935   LYMPHSABS 1.5 02/09/2011 0935   MONOABS 0.4 02/09/2011 0935   EOSABS 0.2 02/09/2011 0935   BASOSABS 0.0 02/09/2011 0935    Hgb A1C No results found for this basename: HGBA1C         Assessment & Plan:   Strep Pharyngitis:  RST- positive No need to send off culture Continue Ibuprofen OTC eRx for Amoxil BID x 10 days Wash your hands throughly  RTC as needed or if symptoms persist or worsen

## 2013-08-12 NOTE — Progress Notes (Signed)
HPI: Pt presents to the office today with sore throat. He states it started two days ago with a scratchy throat and has progressively gotten worse.He endorses aches, neck pain, chills, fever, and fatigue. He denies cough, congestion, nasal congestion, rash, or shortness of breath. He has taken OTC Ibuprofen with relief.  He has sick contacts with his wife and son who both had strep.   Past Medical History  Diagnosis Date  . Childhood asthma   . 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 Fever blister   . Other kyphosis (acquired)     thoracic  . Unspecified tinnitus   . Trochlear nerve palsy, right eye     congenital Information systems manager)    Current Outpatient Prescriptions  Medication Sig Dispense Refill  . fexofenadine (ALLEGRA) 180 MG tablet Take 180 mg by mouth daily.      . mineral oil-hydrophilic petrolatum (AQUAPHOR) ointment Apply 1 application topically as needed.        . loratadine (CLARITIN) 10 MG tablet Take 10 mg by mouth daily.       No current facility-administered medications for this visit.    Allergies  Allergen Reactions  . Theophyllines Nausea And Vomiting    As teen    Family History  Problem Relation Age of Onset  . Hypertension Mother   . Hyperlipidemia Mother   . Healthy Father   . Healthy Brother   . Coronary artery disease Paternal Grandfather   . Alcohol abuse Paternal Grandfather   . Cancer Neg Hx   . Stroke Neg Hx   . Diabetes Neg Hx     History   Social History  . Marital Status: Married    Spouse Name: N/A    Number of Children: 2  . Years of Education: N/A   Occupational History  . Librarian, academic   . Youth pastor    Social History Main Topics  . Smoking status: Never Smoker   . Smokeless tobacco: Never Used  . Alcohol Use: No  . Drug Use: No  . Sexual Activity: Not on file   Other Topics Concern  . 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  Denies , malaise, headache or abrupt weight changes.  HEENT:Endorses sore throat. Denies eye pain, eye redness, ear pain, ringing in the ears, wax buildup, runny nose, nasal congestion, or bloody nose. Respiratory: Denies difficulty breathing, shortness of breath, cough or sputum production.   Cardiovascular: Denies chest pain, chest tightness, palpitations or swelling in the hands or feet.   Skin: Denies redness, rashes, lesions or ulcercations.  .   No other specific complaints in a complete review of systems (except as listed in HPI above).  PE:  BP 130/84  Pulse 82  Temp(Src) 98.2 F (36.8 C) (Oral)  Wt 190 lb 12 oz (86.524 kg)  SpO2 98% Wt Readings from Last 3 Encounters:  08/12/13 190 lb 12 oz (86.524 kg)  05/25/13 192 lb 8 oz (87.317 kg)  02/09/11 189 lb 4 oz (85.843 kg)    General: Appears their stated age, well developed, well nourished in NAD. HEENT: Head: normal shape and size; Eyes: sclera white, no icterus, conjunctiva pink, PERRLA  and EOMs intact; Ears: Tm's gray and intact, normal light reflex; Nose: mucosa pink and moist, septum midline; Throat/Mouth: Teeth present, mucosa pink and moist, 1+ bilateral tonsils, red, edematous, no exudates noted.  Neck: Normal range of motion. Neck supple, trachea midline. No massses, lumps or thyromegaly present.  Cardiovascular: Normal rate and rhythm. S1,S2 noted.  No murmur, rubs or gallops noted. No JVD or BLE edema. No carotid bruits noted. Pulmonary/Chest: Normal effort and positive vesicular breath sounds. No respiratory distress. No wheezes, rales or ronchi noted.    Assessment and Plan: Strep throat: Rapid strep positive Amoxicillin 875mg  PO BID for 10days Continue Ibuprofen for pain Please dispose or boil tooth brushes and  watch close contact and sharing cups and utensils at home Follow up in 3-5 days if no better  Asherah Lavoy S, Student-NP

## 2013-08-26 ENCOUNTER — Ambulatory Visit (INDEPENDENT_AMBULATORY_CARE_PROVIDER_SITE_OTHER): Payer: BC Managed Care – PPO | Admitting: Family Medicine

## 2013-08-26 ENCOUNTER — Encounter: Payer: Self-pay | Admitting: Family Medicine

## 2013-08-26 VITALS — BP 110/78 | HR 74 | Temp 98.4°F | Wt 193.0 lb

## 2013-08-26 DIAGNOSIS — J029 Acute pharyngitis, unspecified: Secondary | ICD-10-CM

## 2013-08-26 DIAGNOSIS — J020 Streptococcal pharyngitis: Secondary | ICD-10-CM | POA: Insufficient documentation

## 2013-08-26 LAB — POCT RAPID STREP A (OFFICE): Rapid Strep A Screen: POSITIVE — AB

## 2013-08-26 MED ORDER — AMOXICILLIN-POT CLAVULANATE 875-125 MG PO TABS: 1.0000 | ORAL_TABLET | Freq: Two times a day (BID) | ORAL | Status: DC

## 2013-08-26 NOTE — Assessment & Plan Note (Signed)
D/w pt.  Nontoxic.  2nd episode, start augmentin.  Supportive care.  F/u prn.

## 2013-08-26 NOTE — Progress Notes (Signed)
Pre visit review using our clinic review tool, if applicable. No additional management support is needed unless otherwise documented below in the visit note. 

## 2013-08-26 NOTE — Patient Instructions (Signed)
Start the antibiotics today and try to get some rest.  Gargle with salt water in the meantime.   Out of work until your sore throat is resolved.  Take care.

## 2013-08-26 NOTE — Progress Notes (Signed)
Mult known sick exposures, all with strep pos testing.  Pt recently with amoxil for RST.  Sx returned Sunday night.  In the interval, ST got worse.  No fevers known.  Minimal cough.    Meds, vitals, and allergies reviewed.   ROS: See HPI.  Otherwise, noncontributory.  GEN: nad, alert and oriented HEENT: mucous membranes moist, tm w/o erythema, nasal exam w/o erythema, clear discharge noted,  OP with cobblestoning and exudates.  NECK: supple w/tender LA CV: rrr.   PULM: ctab, no inc wob EXT: no edema SKIN: no acute rash  RST positive.

## 2014-01-25 ENCOUNTER — Other Ambulatory Visit: Payer: Self-pay | Admitting: Family Medicine

## 2014-01-25 DIAGNOSIS — D751 Secondary polycythemia: Secondary | ICD-10-CM

## 2014-01-25 DIAGNOSIS — Z0000 Encounter for general adult medical examination without abnormal findings: Secondary | ICD-10-CM

## 2014-01-26 ENCOUNTER — Other Ambulatory Visit: Payer: BC Managed Care – PPO

## 2014-01-28 ENCOUNTER — Other Ambulatory Visit (INDEPENDENT_AMBULATORY_CARE_PROVIDER_SITE_OTHER): Payer: BC Managed Care – PPO

## 2014-01-28 DIAGNOSIS — Z0000 Encounter for general adult medical examination without abnormal findings: Secondary | ICD-10-CM

## 2014-01-28 DIAGNOSIS — D751 Secondary polycythemia: Secondary | ICD-10-CM

## 2014-01-28 DIAGNOSIS — D45 Polycythemia vera: Secondary | ICD-10-CM

## 2014-01-28 DIAGNOSIS — R17 Unspecified jaundice: Secondary | ICD-10-CM

## 2014-01-28 LAB — CBC WITH DIFFERENTIAL/PLATELET
BASOS PCT: 0.3 % (ref 0.0–3.0)
Basophils Absolute: 0 10*3/uL (ref 0.0–0.1)
EOS PCT: 2.5 % (ref 0.0–5.0)
Eosinophils Absolute: 0.1 10*3/uL (ref 0.0–0.7)
HEMATOCRIT: 46 % (ref 39.0–52.0)
HEMOGLOBIN: 15.5 g/dL (ref 13.0–17.0)
LYMPHS ABS: 1.7 10*3/uL (ref 0.7–4.0)
Lymphocytes Relative: 41.5 % (ref 12.0–46.0)
MCHC: 33.8 g/dL (ref 30.0–36.0)
MCV: 87.2 fl (ref 78.0–100.0)
Monocytes Absolute: 0.3 10*3/uL (ref 0.1–1.0)
Monocytes Relative: 7.8 % (ref 3.0–12.0)
Neutro Abs: 1.9 10*3/uL (ref 1.4–7.7)
Neutrophils Relative %: 47.9 % (ref 43.0–77.0)
Platelets: 192 10*3/uL (ref 150.0–400.0)
RBC: 5.27 Mil/uL (ref 4.22–5.81)
RDW: 13 % (ref 11.5–15.5)
WBC: 4 10*3/uL (ref 4.0–10.5)

## 2014-01-28 LAB — COMPREHENSIVE METABOLIC PANEL
ALBUMIN: 4 g/dL (ref 3.5–5.2)
ALT: 20 U/L (ref 0–53)
AST: 17 U/L (ref 0–37)
Alkaline Phosphatase: 55 U/L (ref 39–117)
BILIRUBIN TOTAL: 1.2 mg/dL (ref 0.2–1.2)
BUN: 11 mg/dL (ref 6–23)
CO2: 29 mEq/L (ref 19–32)
Calcium: 9.3 mg/dL (ref 8.4–10.5)
Chloride: 106 mEq/L (ref 96–112)
Creatinine, Ser: 0.8 mg/dL (ref 0.4–1.5)
GFR: 121.83 mL/min (ref >60.00)
GLUCOSE: 83 mg/dL (ref 70–99)
Potassium: 3.9 mEq/L (ref 3.5–5.1)
Sodium: 140 mEq/L (ref 135–145)
Total Protein: 6.4 g/dL (ref 6.0–8.3)

## 2014-01-28 LAB — LIPID PANEL
CHOLESTEROL: 162 mg/dL (ref 0–200)
HDL: 53.3 mg/dL (ref >39.00)
LDL CALC: 95 mg/dL (ref 0–99)
NonHDL: 108.7
TRIGLYCERIDES: 67 mg/dL (ref 0.0–149.0)
Total CHOL/HDL Ratio: 3
VLDL: 13.4 mg/dL (ref 0.0–40.0)

## 2014-02-04 ENCOUNTER — Ambulatory Visit (INDEPENDENT_AMBULATORY_CARE_PROVIDER_SITE_OTHER): Payer: BC Managed Care – PPO | Admitting: Family Medicine

## 2014-02-04 ENCOUNTER — Encounter: Payer: Self-pay | Admitting: Family Medicine

## 2014-02-04 VITALS — BP 118/64 | HR 66 | Temp 98.4°F | Ht 70.5 in | Wt 182.2 lb

## 2014-02-04 DIAGNOSIS — Z0000 Encounter for general adult medical examination without abnormal findings: Secondary | ICD-10-CM

## 2014-02-04 DIAGNOSIS — D239 Other benign neoplasm of skin, unspecified: Secondary | ICD-10-CM

## 2014-02-04 DIAGNOSIS — M40299 Other kyphosis, site unspecified: Secondary | ICD-10-CM

## 2014-02-04 NOTE — Progress Notes (Signed)
Pre visit review using our clinic review tool, if applicable. No additional management support is needed unless otherwise documented below in the visit note. 

## 2014-02-04 NOTE — Progress Notes (Signed)
BP 118/64  Pulse 66  Temp(Src) 98.4 F (36.9 C) (Oral)  Ht 5' 10.5" (1.791 m)  Wt 182 lb 4 oz (82.668 kg)  BMI 25.77 kg/m2  SpO2 97%   CC: CPE  Subjective:    Patient ID: Benjamin Norman, male    DOB: 1975-07-08, 38 y.o.   MRN: [id number]  HPI: MARTINEZ BOXX is a 38 y.o. male presenting on 02/04/2014 for Annual Exam   Sees dermatologist yearly. H/o atypical nevi. Sees once yearly (Dr. Allyson Sabal) Trying to eat healthier. Some seasonal allergies.  Preventative: Flu shot at work. Td 2009  Sunscreen use discussed. Regularly sees dermatologist.  Seat belt use discussed.  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  Diet: good water, fruits/vegetables daily  Relevant past medical, surgical, family and social history reviewed and updated as indicated.  Allergies and medications reviewed and updated. Current Outpatient Prescriptions on File Prior to Visit  Medication Sig  . fexofenadine (ALLEGRA) 180 MG tablet Take 180 mg by mouth daily as needed.   . loratadine (CLARITIN) 10 MG tablet Take 10 mg by mouth daily.   . mineral oil-hydrophilic petrolatum (AQUAPHOR) ointment Apply 1 application topically as needed.     No current facility-administered medications on file prior to visit.    Review of Systems  Constitutional: Negative for fever, chills, activity change, appetite change, fatigue and unexpected weight change.  HENT: Negative for hearing loss.   Eyes: Negative for visual disturbance.  Respiratory: Negative for cough, chest tightness, shortness of breath and wheezing.   Cardiovascular: Negative for chest pain, palpitations and leg swelling.  Gastrointestinal: Negative for nausea, vomiting, abdominal pain, diarrhea, constipation, blood in stool and abdominal distention.  Genitourinary: Negative for hematuria and difficulty urinating.    Musculoskeletal: Negative for arthralgias, myalgias and neck pain.  Skin: Negative for rash.  Neurological: Negative for dizziness, seizures, syncope and headaches.  Hematological: Negative for adenopathy. Does not bruise/bleed easily.  Psychiatric/Behavioral: Negative for dysphoric mood. The patient is not nervous/anxious.    Per HPI unless specifically indicated above    Objective:    BP 118/64  Pulse 66  Temp(Src) 98.4 F (36.9 C) (Oral)  Ht 5' 10.5" (1.791 m)  Wt 182 lb 4 oz (82.668 kg)  BMI 25.77 kg/m2  SpO2 97%  Physical Exam  Nursing note and vitals reviewed. Constitutional: He is oriented to person, place, and time. He appears well-developed and well-nourished. No distress.  HENT:  Head: Normocephalic and atraumatic.  Right Ear: Hearing, tympanic membrane, external ear and ear canal normal.  Left Ear: Hearing, tympanic membrane, external ear and ear canal normal.  Nose: Nose normal.  Mouth/Throat: Uvula is midline, oropharynx is clear and moist and mucous membranes are normal. No oropharyngeal exudate, posterior oropharyngeal edema or posterior oropharyngeal erythema.  Eyes: Conjunctivae and EOM are normal. Pupils are equal, round, and reactive to light. No scleral icterus.  Neck: Normal range of motion. Neck supple. No thyromegaly present.  Cardiovascular: Normal rate, regular rhythm, normal heart sounds and intact distal pulses.   No murmur heard. Pulses:      Radial pulses are 2+ on the right side, and 2+ on the left side.  Pulmonary/Chest: Effort normal and breath sounds normal. No respiratory distress. He has no wheezes. He has no rales.  Abdominal: Soft. Normal appearance and bowel sounds are normal. He exhibits  no distension and no mass. There is no hepatosplenomegaly. There is no tenderness. There is no rebound and no guarding.  Musculoskeletal: Normal range of motion. He exhibits no edema.  Thoracic kyphosis as well as minimal thoracic scoliosis present,  corrects with standing up straight.  Lymphadenopathy:    He has no cervical adenopathy.  Neurological: He is alert and oriented to person, place, and time.  CN grossly intact, station and gait intact  Skin: Skin is warm and dry. No rash noted.  Psychiatric: He has a normal mood and affect. His behavior is normal. Judgment and thought content normal.   Results for orders placed in visit on 01/28/14  CBC WITH DIFFERENTIAL      Result Value Ref Range   WBC 4.0  4.0 - 10.5 K/uL   RBC 5.27  4.22 - 5.81 Mil/uL   Hemoglobin 15.5  13.0 - 17.0 g/dL   HCT 46.0  39.0 - 52.0 %   MCV 87.2  78.0 - 100.0 fl   MCHC 33.8  30.0 - 36.0 g/dL   RDW 13.0  11.5 - 15.5 %   Platelets 192.0  150.0 - 400.0 K/uL   Neutrophils Relative % 47.9  43.0 - 77.0 %   Lymphocytes Relative 41.5  12.0 - 46.0 %   Monocytes Relative 7.8  3.0 - 12.0 %   Eosinophils Relative 2.5  0.0 - 5.0 %   Basophils Relative 0.3  0.0 - 3.0 %   Neutro Abs 1.9  1.4 - 7.7 K/uL   Lymphs Abs 1.7  0.7 - 4.0 K/uL   Monocytes Absolute 0.3  0.1 - 1.0 K/uL   Eosinophils Absolute 0.1  0.0 - 0.7 K/uL   Basophils Absolute 0.0  0.0 - 0.1 K/uL  COMPREHENSIVE METABOLIC PANEL      Result Value Ref Range   Sodium 140  135 - 145 mEq/L   Potassium 3.9  3.5 - 5.1 mEq/L   Chloride 106  96 - 112 mEq/L   CO2 29  19 - 32 mEq/L   Glucose, Bld 83  70 - 99 mg/dL   BUN 11  6 - 23 mg/dL   Creatinine, Ser 0.8  0.4 - 1.5 mg/dL   Total Bilirubin 1.2  0.2 - 1.2 mg/dL   Alkaline Phosphatase 55  39 - 117 U/L   AST 17  0 - 37 U/L   ALT 20  0 - 53 U/L   Total Protein 6.4  6.0 - 8.3 g/dL   Albumin 4.0  3.5 - 5.2 g/dL   Calcium 9.3  8.4 - 10.5 mg/dL   GFR 121.83  >60.00 mL/min  LIPID PANEL      Result Value Ref Range   Cholesterol 162  0 - 200 mg/dL   Triglycerides 67.0  0.0 - 149.0 mg/dL   HDL 53.30  >39.00 mg/dL   VLDL 13.4  0.0 - 40.0 mg/dL   LDL Cholesterol 95  0 - 99 mg/dL   Total CHOL/HDL Ratio 3     NonHDL 108.70        Assessment & Plan:    Problem List Items Addressed This Visit   THORACIC KYPHOSIS     Discussed this - recommended standing straight, discussed lumbar support when sitting. Continue to monitor. No exercise limitations, no dyspnea.    NEVUS, ATYPICAL     Sees dermatologist regularly.    Healthcare maintenance - Primary     Preventative protocols reviewed and updated unless pt declined. Discussed healthy diet and  lifestyle.        Follow up plan: Return in about 1 year (around 02/05/2015), or as needed, for physical.

## 2014-02-04 NOTE — Patient Instructions (Signed)
Good to see you today, call us with questions. Return as needed or in 1-2 years for next physical.

## 2014-02-04 NOTE — Assessment & Plan Note (Signed)
Preventative protocols reviewed and updated unless pt declined. Discussed healthy diet and lifestyle.  

## 2014-02-04 NOTE — Assessment & Plan Note (Signed)
Discussed this - recommended standing straight, discussed lumbar support when sitting. Continue to monitor. No exercise limitations, no dyspnea.

## 2014-02-04 NOTE — Assessment & Plan Note (Signed)
Sees dermatologist regularly.

## 2014-12-16 ENCOUNTER — Encounter: Payer: Self-pay | Admitting: Family Medicine

## 2014-12-16 ENCOUNTER — Ambulatory Visit (INDEPENDENT_AMBULATORY_CARE_PROVIDER_SITE_OTHER): Payer: PRIVATE HEALTH INSURANCE | Admitting: Family Medicine

## 2014-12-16 VITALS — BP 130/60 | HR 66 | Temp 97.7°F | Wt 198.0 lb

## 2014-12-16 DIAGNOSIS — R21 Rash and other nonspecific skin eruption: Secondary | ICD-10-CM

## 2014-12-16 MED ORDER — TERBINAFINE HCL 250 MG PO TABS: 250.0000 mg | ORAL_TABLET | Freq: Every day | ORAL | Status: DC

## 2014-12-16 MED ORDER — MICONAZOLE NITRATE 2 % POWD: Status: DC

## 2014-12-16 NOTE — Progress Notes (Signed)
Pre visit review using our clinic review tool, if applicable. No additional management support is needed unless otherwise documented below in the visit note. 

## 2014-12-16 NOTE — Assessment & Plan Note (Signed)
Rash consistent with tinea pedis - failed topical cream for last month. Treat with terbinafine 250mg  oral daily x 2 wks as well as miconazole powder in shoes. Update with effect. Educational handout provided today.

## 2014-12-16 NOTE — Progress Notes (Signed)
   BP 130/60 mmHg  Pulse 66  Temp(Src) 97.7 F (36.5 C) (Oral)  Wt 198 lb (89.812 kg)   CC: check rash on right foot  Subjective:    Patient ID: Benjamin Norman, male    DOB: 08/01/1975, 39 y.o.   MRN: [id number]  HPI: Benjamin Norman is a 39 y.o. male presenting on 12/16/2014 for Rash   Rash present on right foot. Has been using clotrimazole cream BID regularly for last month. Started right inner sole at 1st MTP. Lesion has drained some as well. Not itchy or tender except when drained once.  No fevers/chills, no other new rashes. No joint pains. No new products.   Started after he increased running. Did spartan race.   Relevant past medical, surgical, family and social history reviewed and updated as indicated. Interim medical history since our last visit reviewed. Allergies and medications reviewed and updated. Current Outpatient Prescriptions on File Prior to Visit  Medication Sig  . fexofenadine (ALLEGRA) 180 MG tablet Take 180 mg by mouth daily as needed.   . loratadine (CLARITIN) 10 MG tablet Take 10 mg by mouth daily.   . mineral oil-hydrophilic petrolatum (AQUAPHOR) ointment Apply 1 application topically as needed.     No current facility-administered medications on file prior to visit.    Review of Systems Per HPI unless specifically indicated above     Objective:    BP 130/60 mmHg  Pulse 66  Temp(Src) 97.7 F (36.5 C) (Oral)  Wt 198 lb (89.812 kg)  Wt Readings from Last 3 Encounters:  12/16/14 198 lb (89.812 kg)  02/04/14 182 lb 4 oz (82.668 kg)  08/26/13 193 lb (87.544 kg)    Physical Exam  Constitutional: He appears well-developed and well-nourished. No distress.  Musculoskeletal: He exhibits no edema.  Neurovascularly intact  Skin: Skin is warm and dry. Rash noted.  Papular slightly vesicular rash sole of R foot at MT as well as isolated papules on digits. No pruritis or tenderness Maceration between 4th and 5th toes on right foot  Nursing note and  vitals reviewed.  Lab Results  Component Value Date   ALT 20 01/28/2014   AST 17 01/28/2014   ALKPHOS 55 01/28/2014   BILITOT 1.2 01/28/2014      Assessment & Plan:   Problem List Items Addressed This Visit    Skin rash - Primary    Rash consistent with tinea pedis - failed topical cream for last month. Treat with terbinafine 250mg  oral daily x 2 wks as well as miconazole powder in shoes. Update with effect. Educational handout provided today.          Follow up plan: Return if symptoms worsen or fail to improve.

## 2014-12-16 NOTE — Patient Instructions (Signed)

## 2015-01-03 ENCOUNTER — Encounter: Payer: Self-pay | Admitting: Family Medicine

## 2015-01-03 ENCOUNTER — Ambulatory Visit (INDEPENDENT_AMBULATORY_CARE_PROVIDER_SITE_OTHER): Payer: PRIVATE HEALTH INSURANCE | Admitting: Family Medicine

## 2015-01-03 VITALS — BP 124/72 | HR 66 | Temp 97.7°F | Wt 195.0 lb

## 2015-01-03 DIAGNOSIS — R21 Rash and other nonspecific skin eruption: Secondary | ICD-10-CM

## 2015-01-03 MED ORDER — TERBINAFINE HCL 250 MG PO TABS: 250.0000 mg | ORAL_TABLET | Freq: Every day | ORAL | Status: DC

## 2015-01-03 NOTE — Assessment & Plan Note (Signed)
Restart lamisil PO, continue topical tx.  We can treat the likely plantar wart later on.  He agrees.  F/u prn o/w.

## 2015-01-03 NOTE — Progress Notes (Signed)
Pre visit review using our clinic review tool, if applicable. No additional management support is needed unless otherwise documented below in the visit note.  Had used OTC antifungal cream.  Added on lamisil tabs in the meantime x14d.  Has been compliant.  He was getting better, his R foot was clearing up.  As soon as he stopped the pills, his sx got worse.  Now with painful rash on the foot and some more redness.   Meds, vitals, and allergies reviewed.   ROS: See HPI.  Otherwise, noncontributory.  nad L foot with mild tinea changes ball of L foot with small likely plantar wart. No spreading acute erythema.   Normal cap refill and DP pulse.

## 2015-01-03 NOTE — Patient Instructions (Signed)
Restart the lamisil.  Continue the other meds.  When done and better, then either use the plantar wart pads or we can freeze the lesion.  Take care.  Glad to see you.

## 2015-01-14 ENCOUNTER — Telehealth: Payer: Self-pay | Admitting: *Deleted

## 2015-01-14 DIAGNOSIS — R21 Rash and other nonspecific skin eruption: Secondary | ICD-10-CM

## 2015-01-14 MED ORDER — TERBINAFINE HCL 250 MG PO TABS: 250.0000 mg | ORAL_TABLET | Freq: Every day | ORAL | Status: DC

## 2015-01-14 NOTE — Telephone Encounter (Signed)
Agree with extending course. Sent in 2 more wks of terbinafine.

## 2015-01-14 NOTE — Telephone Encounter (Signed)
Patient advised.

## 2015-01-14 NOTE — Telephone Encounter (Signed)
Patient is not sure who to pose this question to, Dr. Damita Dunnings who saw him last or Dr. Darnell Level who saw him previously.  Patient has 2 tablets of medication remaining and the condition on his foot is not completely healed up.  It is much better but at least one or two spots are still "active".  The spots seem to come to a head, then a small amount of pus comes out, then a "seed" emerges and it then heals.  These last two spots have not went through that cycle and last time, once his medication ran out, it all seemed to flare up again. He is leaving on a 2 week trip to [place] on Sunday.  Please advise.

## 2015-01-14 NOTE — Telephone Encounter (Signed)
G- Please talk to me about this.  I think we should extend tx.  Thanks.

## 2015-10-19 ENCOUNTER — Encounter: Payer: Self-pay | Admitting: Primary Care

## 2015-10-19 ENCOUNTER — Ambulatory Visit (INDEPENDENT_AMBULATORY_CARE_PROVIDER_SITE_OTHER): Payer: Managed Care, Other (non HMO) | Admitting: Primary Care

## 2015-10-19 VITALS — BP 136/84 | HR 68 | Temp 97.6°F | Ht 70.5 in | Wt 190.0 lb

## 2015-10-19 DIAGNOSIS — R000 Tachycardia, unspecified: Secondary | ICD-10-CM

## 2015-10-19 LAB — CBC
HCT: 50.1 % (ref 39.0–52.0)
HEMOGLOBIN: 16.9 g/dL (ref 13.0–17.0)
MCHC: 33.7 g/dL (ref 30.0–36.0)
MCV: 86.1 fl (ref 78.0–100.0)
PLATELETS: 183 10*3/uL (ref 150.0–400.0)
RBC: 5.82 Mil/uL — AB (ref 4.22–5.81)
RDW: 13.1 % (ref 11.5–15.5)
WBC: 5.3 10*3/uL (ref 4.0–10.5)

## 2015-10-19 LAB — COMPREHENSIVE METABOLIC PANEL
ALBUMIN: 4.7 g/dL (ref 3.5–5.2)
ALT: 22 U/L (ref 0–53)
AST: 18 U/L (ref 0–37)
Alkaline Phosphatase: 56 U/L (ref 39–117)
BILIRUBIN TOTAL: 1.6 mg/dL — AB (ref 0.2–1.2)
BUN: 14 mg/dL (ref 6–23)
CALCIUM: 9.5 mg/dL (ref 8.4–10.5)
CHLORIDE: 106 meq/L (ref 96–112)
CO2: 26 meq/L (ref 19–32)
Creatinine, Ser: 0.84 mg/dL (ref 0.40–1.50)
GFR: 107.58 mL/min (ref >60.00)
Glucose, Bld: 86 mg/dL (ref 70–99)
Potassium: 3.5 mEq/L (ref 3.5–5.1)
Sodium: 140 mEq/L (ref 135–145)
Total Protein: 7 g/dL (ref 6.0–8.3)

## 2015-10-19 LAB — TSH: TSH: 0.92 u[IU]/mL (ref 0.35–4.50)

## 2015-10-19 NOTE — Progress Notes (Signed)
Subjective:    Patient ID: Benjamin Norman, male    DOB: 23-Feb-1976, 40 y.o.   MRN: [id number]  HPI  [name] is a 40 year old male who presents today with a chief complaint of tachycardia. His tachycardia began Tuesday this week while doing leg exercises at the gym. He had been working out for 30 minutes prior to this episode. He stopped working out as soon as he noticed these symptoms. His tachycardia lasted for about 1.5 hours for which he suddenly felt return of his heart to the normal rate after 1.5 hours.   He monitored his heart rate on his fit bit and noted his HR to be 160-190 during his episode. He did experience some back discomfort and fatigue after his heart rate had been up for a while.   He's had this occur several times several years ago during exercise. He denies large quantities of caffeine consumption, recent stress, dizziness, syncope, chest pain. He has family history of hypertension and hyperlipidemia, denies immediate family history of heart attack. His paternal grandfather did pass away from a myocardial infarction.   His last episode of tachycardia occurred in August of 2016 while running around and after a considerable amount of caffeine consumption. He was once evaluated with a Holter monitor which was "normal" per patient.  Review of Systems  Constitutional: Negative for fatigue.  Eyes: Negative for visual disturbance.  Respiratory: Negative for shortness of breath.   Cardiovascular: Positive for palpitations. Negative for chest pain.  Neurological: Negative for dizziness, syncope and headaches.       Past Medical History  Diagnosis Date  . Childhood asthma   . 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 Fever blister   . Other kyphosis (acquired)     thoracic  . Unspecified tinnitus   . Trochlear nerve palsy, right eye     congenital Information systems manager)     Social History   Social History  . Marital Status: Married    Spouse Name:  N/A  . Number of Children: 2  . Years of Education: N/A   Occupational History  . Librarian, academic   . Youth pastor    Social History Main Topics  . Smoking status: Never Smoker   . Smokeless tobacco: Never Used  . Alcohol Use: No  . Drug Use: No  . Sexual Activity: Not on file   Other Topics Concern  . 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  Family History  Problem Relation Age of Onset  . Hypertension Mother   . Hyperlipidemia Mother   . Healthy Father   . Healthy Brother   . Coronary artery disease Paternal Grandfather   . Alcohol abuse Paternal Grandfather   . Cancer Neg Hx   . Stroke Neg Hx   . Diabetes Neg Hx     Allergies  Allergen Reactions  . Theophyllines Nausea And Vomiting    As teen    Current Outpatient Prescriptions on File Prior to Visit  Medication Sig Dispense Refill  . clotrimazole (LOTRIMIN) 1 % cream Apply 1 application topically 2 (two) times daily. Reported on 10/19/2015    . fexofenadine (ALLEGRA) 180  MG tablet Take 180 mg by mouth daily as needed. Reported on 10/19/2015    . loratadine (CLARITIN) 10 MG tablet Take 10 mg by mouth daily. Reported on 10/19/2015    . Miconazole Nitrate 2 % POWD AAA once daily for 2 weeks (Patient not taking: Reported on 10/19/2015) 10 g 0  . mineral oil-hydrophilic petrolatum (AQUAPHOR) ointment Apply 1 application topically as needed. Reported on 10/19/2015    . terbinafine (LAMISIL) 250 MG tablet Take 1 tablet (250 mg total) by mouth daily. (Patient not taking: Reported on 10/19/2015) 14 tablet 0   No current facility-administered medications on file prior to visit.    BP 136/84 mmHg  Pulse 68  Temp(Src) 97.6 F (36.4 C) (Oral)  Ht 5' 10.5" (1.791 m)  Wt 190  lb (86.183 kg)  BMI 26.87 kg/m2  SpO2 98%    Objective:   Physical Exam  Constitutional: He appears well-nourished.  Cardiovascular: Normal rate, regular rhythm and normal heart sounds.   No murmur heard. Pulmonary/Chest: Effort normal and breath sounds normal.  Skin: Skin is warm and dry.          Assessment & Plan:  Tachycardia vs SVT:  Present intermittently for numerous years, last episode prior to yesterday was in August 2016. This episode lasted 1.5 hours with HR in 160-190. ECG today with sinus bradycardia, no ST elevation or depression. T-wave inversion to V1 and V2. No PVC/PAC. ECG grossly normal. Given reoccurrence and length of this last episode, will send to cardiology for Holter monitor placement and further evaluation. Labs today to rule out any metabolic cause. Strict return precautions/ED precautions provided.

## 2015-10-19 NOTE — Patient Instructions (Addendum)
Complete lab work prior to leaving today. I will notify you of your results once received.   Your ECG appears normal which is reassuring.  You will be contacted regarding your referral to Cardiology.  Please let us know if you have not heard back within one week.   Ensure you are limiting your consumption of caffeine. If the episode occurs again and does not dissipate within 30 minutes, please call me or go to the Emergency Department.   It was a pleasure meeting you!

## 2015-10-19 NOTE — Progress Notes (Signed)
Pre visit review using our clinic review tool, if applicable. No additional management support is needed unless otherwise documented below in the visit note. 

## 2015-10-27 ENCOUNTER — Ambulatory Visit: Payer: Managed Care, Other (non HMO) | Admitting: Cardiology

## 2015-11-02 ENCOUNTER — Ambulatory Visit (INDEPENDENT_AMBULATORY_CARE_PROVIDER_SITE_OTHER): Payer: Managed Care, Other (non HMO) | Admitting: Cardiology

## 2015-11-02 ENCOUNTER — Encounter: Payer: Self-pay | Admitting: Cardiology

## 2015-11-02 DIAGNOSIS — I471 Supraventricular tachycardia: Secondary | ICD-10-CM | POA: Diagnosis not present

## 2015-11-02 DIAGNOSIS — R002 Palpitations: Secondary | ICD-10-CM | POA: Diagnosis not present

## 2015-11-02 DIAGNOSIS — R079 Chest pain, unspecified: Secondary | ICD-10-CM

## 2015-11-02 NOTE — Patient Instructions (Addendum)
Medication Instructions:  Your physician recommends that you continue on your current medications as directed. Please refer to the Current Medication list given to you today.   Labwork: None ordered  Testing/Procedures: Your physician has requested that you have an echocardiogram. Echocardiography is a painless test that uses sound waves to create images of your heart. It provides your doctor with information about the size and shape of your heart and how well your heart's chambers and valves are working. This procedure takes approximately one hour. There are no restrictions for this procedure.  Date & Time: ______________________________________________________________________  Your physician has requested that you have an exercise tolerance test. For further information please visit [email or web address]. Please also follow instruction sheet, as given.  Date & Time: _______________________________________________________________________  Your physician has recommended that you wear an event monitor. Event monitors are medical devices that record the heart's electrical activity. Doctors most often [place] these monitors to diagnose arrhythmias. Arrhythmias are problems with the speed or rhythm of the heartbeat. The monitor is a small, portable device. You can wear one while you do your normal daily activities. This is usually used to diagnose what is causing palpitations/syncope (passing out).  Will be mailed to you. They will call prior to mailing out to confirm mailing address.  Follow-Up: Your physician recommends that you schedule a follow-up appointment after testing with Dr. Yvone Neu.  Date & Time: ______________________________________________________________   Any Other Special Instructions Will Be Listed Below (If Applicable).     If you need a refill on your cardiac medications before your next appointment, please call your pharmacy.  Echocardiogram An echocardiogram, or  echocardiography, uses sound waves (ultrasound) to produce an image of your heart. The echocardiogram is simple, painless, obtained within a short period of time, and offers valuable information to your health care provider. The images from an echocardiogram can provide information such as:  Evidence of coronary artery disease (CAD).  Heart size.  Heart muscle function.  Heart valve function.  Aneurysm detection.  Evidence of a past heart attack.  Fluid buildup around the heart.  Heart muscle thickening.  Assess heart valve function. LET [facility] CARE PROVIDER KNOW ABOUT:  Any allergies you have.  All medicines you are taking, including vitamins, herbs, eye drops, creams, and over-the-counter medicines.  Previous problems you or members of your family have had with the use of anesthetics.  Any blood disorders you have.  Previous surgeries you have had.  Medical conditions you have.  Possibility of pregnancy, if this applies. BEFORE THE PROCEDURE  No special preparation is needed. Eat and drink normally.  PROCEDURE   In order to produce an image of your heart, gel will be applied to your chest and a wand-like tool (transducer) will be moved over your chest. The gel will help transmit the sound waves from the transducer. The sound waves will harmlessly bounce off your heart to allow the heart images to be captured in real-time motion. These images will then be recorded.  You may need an IV to receive a medicine that improves the quality of the pictures. AFTER THE PROCEDURE You may return to your normal schedule including diet, activities, and medicines, unless your health care provider tells you otherwise.   This information is not intended to replace advice given to you by your health care provider. Make sure you discuss any questions you have with your health care provider.   Document Released: 05/18/2000 Document Revised: 06/11/2014 Document Reviewed:  01/26/2013 Elsevier Interactive Patient Education 2016  [place].   Exercise Stress Electrocardiogram An exercise stress electrocardiogram is a test that is done to evaluate the blood supply to your heart. This test may also be called exercise stress electrocardiography. The test is done while you are walking on a treadmill. The goal of this test is to raise your heart rate. This test is done to find areas of poor blood flow to the heart by determining the extent of coronary artery disease (CAD).   CAD is defined as narrowing in one or more heart (coronary) arteries of more than 70%. If you have an abnormal test result, this may mean that you are not getting adequate blood flow to your heart during exercise. Additional testing may be needed to understand why your test was abnormal. LET [facility] CARE PROVIDER KNOW ABOUT:   Any allergies you have.  All medicines you are taking, including vitamins, herbs, eye drops, creams, and over-the-counter medicines.  Previous problems you or members of your family have had with the use of anesthetics.  Any blood disorders you have.  Previous surgeries you have had.  Medical conditions you have.  Possibility of pregnancy, if this applies. RISKS AND COMPLICATIONS Generally, this is a safe procedure. However, as with any procedure, complications can occur. Possible complications can include:  Pain or pressure in the following areas:  Chest.  Jaw or neck.  Between your shoulder blades.  Radiating down your left arm.  Dizziness or light-headedness.  Shortness of breath.  Increased or irregular heartbeats.  Nausea or vomiting.  Heart attack (rare). BEFORE THE PROCEDURE  Avoid all forms of caffeine 24 hours before your test or as directed by your health care provider. This includes coffee, tea (even decaffeinated tea), caffeinated sodas, chocolate, cocoa, and certain pain medicines.  Follow your health care provider's instructions  regarding eating and drinking before the test.  Take your medicines as directed at regular times with water unless instructed otherwise. Exceptions may include:  If you have diabetes, ask how you are to take your insulin or pills. It is common to adjust insulin dosing the morning of the test.  If you are taking beta-blocker medicines, it is important to talk to your health care provider about these medicines well before the date of your test. Taking beta-blocker medicines may interfere with the test. In some cases, these medicines need to be changed or stopped 24 hours or more before the test.  If you wear a nitroglycerin patch, it may need to be removed prior to the test. Ask your health care provider if the patch should be removed before the test.  If you use an inhaler for any breathing condition, bring it with you to the test.  If you are an outpatient, bring a snack so you can eat right after the stress phase of the test.  Do not smoke for 4 hours prior to the test or as directed by your health care provider.  Do not apply lotions, powders, creams, or oils on your chest prior to the test.  Wear loose-fitting clothes and comfortable shoes for the test. This test involves walking on a treadmill. PROCEDURE  Multiple patches (electrodes) will be put on your chest. If needed, small areas of your chest may have to be shaved to get better contact with the electrodes. Once the electrodes are attached to your body, multiple wires will be attached to the electrodes and your heart rate will be monitored.  Your heart will be monitored both at rest and  while exercising.  You will walk on a treadmill. The treadmill will be started at a slow pace. The treadmill speed and incline will gradually be increased to raise your heart rate. AFTER THE PROCEDURE  Your heart rate and blood pressure will be monitored after the test.  You may return to your normal schedule including diet, activities, and  medicines, unless your health care provider tells you otherwise.   This information is not intended to replace advice given to you by your health care provider. Make sure you discuss any questions you have with your health care provider.   Document Released: 05/18/2000 Document Revised: 05/26/2013 Document Reviewed: 01/26/2013 Elsevier Interactive Patient Education 2016 [place].   Cardiac Event Monitoring A cardiac event monitor is a small recording device used to help detect abnormal heart rhythms (arrhythmias). The monitor is used to record heart rhythm when noticeable symptoms such as the following occur:  Fast heartbeats (palpitations), such as heart racing or fluttering.  Dizziness.  Fainting or light-headedness.  Unexplained weakness. The monitor is wired to two electrodes placed on your chest. Electrodes are flat, sticky disks that attach to your skin. The monitor can be worn for up to 30 days. You will wear the monitor at all times, except when bathing.  HOW TO USE YOUR CARDIAC EVENT MONITOR A technician will prepare your chest for the electrode placement. The technician will show you how to place the electrodes, how to work the monitor, and how to replace the batteries. Take time to practice using the monitor before you leave the office. Make sure you understand how to send the information from the monitor to your health care provider. This requires a telephone with a landline, not a cell phone. You need to:  Wear your monitor at all times, except when you are in water:  Do not get the monitor wet.  Take the monitor off when bathing. Do not swim or use a hot tub with it on.  Keep your skin clean. Do not put body lotion or moisturizer on your chest.  Change the electrodes daily or any time they stop sticking to your skin. You might need to use tape to keep them on.  It is possible that your skin under the electrodes could become irritated. To keep this from happening,  try to put the electrodes in slightly different places on your chest. However, they must remain in the area under your left breast and in the upper right section of your chest.  Make sure the monitor is safely clipped to your clothing or in a location close to your body that your health care provider recommends.  Press the button to record when you feel symptoms of heart trouble, such as dizziness, weakness, light-headedness, palpitations, thumping, shortness of breath, unexplained weakness, or a fluttering or racing heart. The monitor is always on and records what happened slightly before you pressed the button, so do not worry about being too late to get good information.  Keep a diary of your activities, such as walking, doing chores, and taking medicine. It is especially important to note what you were doing when you pushed the button to record your symptoms. This will help your health care provider determine what might be contributing to your symptoms. The information stored in your monitor will be reviewed by your health care provider alongside your diary entries.  Send the recorded information as recommended by your health care provider. It is important to understand that it will take some  time for your health care provider to process the results.  Change the batteries as recommended by your health care provider. SEEK IMMEDIATE MEDICAL CARE IF:   You have chest pain.  You have extreme difficulty breathing or shortness of breath.  You develop a very fast heartbeat that persists.  You develop dizziness that does not go away.  You faint or constantly feel you are about to faint.   This information is not intended to replace advice given to you by your health care provider. Make sure you discuss any questions you have with your health care provider.   Document Released: 02/28/2008 Document Revised: 06/11/2014 Document Reviewed: 11/17/2012 Elsevier Interactive Patient Education [employer]

## 2015-11-02 NOTE — Progress Notes (Signed)
Cardiology Office Note   Date:  11/04/2015 11/02/2015 visit  ID:  Benjamin Norman, DOB Dec 10, 1975, MRN [id number]  Referring Doctor:  Ria Bush, MD   Cardiologist:   Wende Bushy, MD   Reason for consultation:  Chief Complaint  Patient presents with  . other    Tachycardia c/o chest discomfort. Meds reviewed verbally.      History of Present Illness: Benjamin Norman is a 40 y.o. male who presents for evaluation of episodes of tachycardia. Patient reports that he's had these episodes for many years, approximately 13 years. Episodes are described as feeling his heart racing very fast, not provoked by anything particular, sometimes with exertion, sometimes not, symptoms mainly in the chest, nonradiating, longest episode was 1.5 hours. This happened a few weeks ago. Usually, it lasts 5-10 minutes at a time. Symptoms are not occurring on a daily basis and he is not able to predict when his can have any episode.  Patient also complains of a sharp pricking pain or discomfort in the chest. Mild intensity, randomly occurring, last a few seconds at a time, sometimes associated with shortness of breath and the discomfort radiating to the back.  Otherwise no episode of loss of consciousness. No fever, cough, colds, abdominal pain. No PND, orthopnea, edema.   ROS:  Please see the history of present illness. Aside from mentioned under HPI, all other systems are reviewed and negative.     Past Medical History  Diagnosis Date  . Childhood asthma   . 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 Fever blister   . Other kyphosis (acquired)     thoracic  . Unspecified tinnitus   . 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 Heart murmur     as child    History reviewed. No pertinent past surgical history.   reports that he has never smoked. He has never used smokeless tobacco. He reports that he does not drink alcohol or use illicit drugs.    family history includes Alcohol abuse in his paternal grandfather; Coronary artery disease in his paternal grandfather; Healthy in his brother and father; Heart attack in his paternal grandmother; Hyperlipidemia in his mother; Hypertension in his mother. There is no history of Cancer, Stroke, or Diabetes.   Current Outpatient Prescriptions  Medication Sig Dispense Refill  . cetirizine (ZYRTEC) 5 MG tablet Take 5 mg by mouth daily.    . mineral oil-hydrophilic petrolatum (AQUAPHOR) ointment Apply 1 application topically as needed. Reported on 10/19/2015     No current facility-administered medications for this visit.    Allergies: Theophyllines    PHYSICAL EXAM: VS:  BP 120/84 mmHg  Pulse 56  Ht 5\' 11"  (1.803 m)  Wt 191 lb (86.637 kg)  BMI 26.65 kg/m2 , Body mass index is 26.65 kg/(m^2). Wt Readings from Last 3 Encounters:  11/02/15 191 lb (86.637 kg)  10/19/15 190 lb (86.183 kg)  01/03/15 195 lb (88.451 kg)    GENERAL:  well developed, well nourished, not in acute distress HEENT: normocephalic, pink conjunctivae, anicteric sclerae, no xanthelasma, normal dentition, oropharynx clear NECK:  no neck vein engorgement, JVP normal, no hepatojugular reflux, carotid upstroke brisk and symmetric, no bruit, no thyromegaly, no lymphadenopathy LUNGS:  good respiratory effort, clear to auscultation bilaterally CV:  PMI not displaced, no thrills, no lifts, S1 and S2 within normal limits, no palpable S3 or S4, no murmurs, no rubs, no gallops ABD:  Soft, nontender, nondistended, normoactive bowel sounds, no abdominal aortic bruit, no hepatomegaly, no splenomegaly MS: nontender back, no kyphosis, no scoliosis, no joint deformities EXT:  2+ DP/PT pulses, no edema, no varicosities, no cyanosis, no clubbing SKIN: warm, nondiaphoretic, normal turgor, no ulcers NEUROPSYCH: alert, oriented to person, place, and time, sensory/motor grossly intact, normal mood, appropriate affect  Recent  Labs: 10/19/2015: ALT 22; BUN 14; Creatinine, Ser 0.84; Hemoglobin 16.9; Platelets 183.0; Potassium 3.5; Sodium 140; TSH 0.92   Lipid Panel    Component Value Date/Time   CHOL 162 01/28/2014 0828   TRIG 67.0 01/28/2014 0828   HDL 53.30 01/28/2014 0828   CHOLHDL 3 01/28/2014 0828   VLDL 13.4 01/28/2014 0828   LDLCALC 95 01/28/2014 0828     Other studies Reviewed:  EKG:  The ekg from 11/02/2015 was personally reviewed by me and it revealed sinus bradycardia 56 BPM.  Additional studies/ records that were reviewed personally reviewed by me today include: None unavailable   ASSESSMENT AND PLAN:  Tachycardia or palpitations Recommend echocardiogram and event monitor for objective evidence of arrhythmia.  Chest pain Atypical in nature. For patient reassurance recommend exercise stress test.  Current medicines are reviewed at length with the patient today.  The patient does not have concerns regarding medicines.  Labs/ tests ordered today include:  Orders Placed This Encounter  Procedures  . Cardiac event monitor  . Exercise Tolerance Test  . EKG 12-Lead  . ECHOCARDIOGRAM COMPLETE    I had a lengthy and detailed discussion with the patient regarding diagnoses, prognosis, diagnostic options, treatment options .   I counseled the patient on importance of lifestyle modification including heart healthy diet, regular physical activity. Once cardiac workup was done.   Disposition:   FU with undersigned after tests    Signed, Wende Bushy, MD  11/04/2015 11:20 AM    [place]

## 2015-11-08 ENCOUNTER — Ambulatory Visit (INDEPENDENT_AMBULATORY_CARE_PROVIDER_SITE_OTHER): Payer: Managed Care, Other (non HMO)

## 2015-11-08 DIAGNOSIS — R079 Chest pain, unspecified: Secondary | ICD-10-CM | POA: Diagnosis not present

## 2015-11-08 DIAGNOSIS — R002 Palpitations: Secondary | ICD-10-CM | POA: Diagnosis not present

## 2015-11-13 ENCOUNTER — Telehealth: Payer: Self-pay | Admitting: Cardiology

## 2015-11-13 NOTE — Telephone Encounter (Signed)
Telephone call from [employer] monitoring noting SVT at 240 bpm.  They stated they were unable to contact the patient.  I reached his cell phone and got a voicemail but could not contact the patient either.  I left him a voicemail that if she was continued to have symptoms that he should proceed to the emergency room.  Kerry Hough. MD [facility] 11/13/2015 10:01 PM

## 2015-11-16 ENCOUNTER — Ambulatory Visit (INDEPENDENT_AMBULATORY_CARE_PROVIDER_SITE_OTHER): Payer: Managed Care, Other (non HMO)

## 2015-11-16 ENCOUNTER — Other Ambulatory Visit: Payer: Self-pay

## 2015-11-16 DIAGNOSIS — R002 Palpitations: Secondary | ICD-10-CM | POA: Diagnosis not present

## 2015-11-16 DIAGNOSIS — R079 Chest pain, unspecified: Secondary | ICD-10-CM

## 2015-11-16 LAB — ECHOCARDIOGRAM COMPLETE
CHL CUP MV DEC (S): 208
E decel time: 208 msec
EERAT: 4.67
FS: 36 % (ref 28–44)
IV/PV OW: 0.87
LA diam end sys: 33 mm
LA diam index: 1.57 cm/m2
LA vol A4C: 36.3 ml
LA vol: 48.3 mL
LASIZE: 33 mm
LAVOLIN: 23 mL/m2
LDCA: 4.52 cm2
LV E/e'average: 4.67
LV PW d: 6.99 mm — AB (ref 0.6–1.1)
LV SIMPSON'S DISK: 66
LV TDI E'LATERAL: 12.4
LV dias vol: 96 mL (ref 62–150)
LV e' LATERAL: 12.4 cm/s
LVDIAVOLIN: 46 mL/m2
LVEEMED: 4.67
LVOT SV: 86 mL
LVOT VTI: 19 cm
LVOTD: 24 mm
LVOTPV: 98.3 cm/s
LVSYSVOL: 33 mL (ref 21–61)
LVSYSVOLIN: 16 mL/m2
MV pk A vel: 47.6 m/s
MVPKEVEL: 57.9 m/s
Stroke v: 63 ml
TAPSE: 21.2 mm
TDI e' medial: 10.9

## 2015-11-16 LAB — EXERCISE TOLERANCE TEST
CHL CUP MPHR: 180 {beats}/min
CSEPEDS: 2 s
CSEPEW: 11.7 METS
CSEPHR: 87 %
CSEPPHR: 157 {beats}/min
Exercise duration (min): 10 min
Rest HR: 76 {beats}/min

## 2015-12-16 ENCOUNTER — Telehealth: Payer: Self-pay | Admitting: *Deleted

## 2015-12-16 DIAGNOSIS — I471 Supraventricular tachycardia: Secondary | ICD-10-CM

## 2015-12-16 MED ORDER — METOPROLOL TARTRATE 25 MG PO TABS: 25.0000 mg | ORAL_TABLET | Freq: Two times a day (BID) | ORAL | Status: DC

## 2015-12-16 NOTE — Telephone Encounter (Signed)
-----   Message from Wende Bushy, MD sent at 12/15/2015  3:57 PM EDT ----- The monitor detected an arrhythmia called an SVT. The heart rate was 248 bpm. During the time of the episode, the on-call cardiologist tried to contact the patient could not be reached. A message was left on voicemail.Marland Kitchen Please refer to separate note. Recommend trial of metoprolol 25 mg twice a day and referral to EP. Monitor blood pressure.

## 2015-12-16 NOTE — Telephone Encounter (Signed)
Reviewed detailed results with patient but he was at a water park and not able to hear well. Instructed him on metoprolol 25 mg twice daily, referral for EP, and monitor blood pressure on new medication. He verbalized understanding of instructions and had no further questions at this time. Instructed him to call back if he had questions once he returned home.

## 2015-12-16 NOTE — Telephone Encounter (Signed)
Spoke with patients wife to review information regarding medications and referral. She verbalized understanding of results, instructions, appointment with Dr. Caryl Comes and had no further questions at this time.

## 2015-12-22 ENCOUNTER — Encounter: Payer: Self-pay | Admitting: Internal Medicine

## 2015-12-22 ENCOUNTER — Ambulatory Visit (INDEPENDENT_AMBULATORY_CARE_PROVIDER_SITE_OTHER): Payer: Managed Care, Other (non HMO) | Admitting: Internal Medicine

## 2015-12-22 VITALS — BP 108/78 | HR 59 | Ht 70.0 in | Wt 189.0 lb

## 2015-12-22 DIAGNOSIS — I471 Supraventricular tachycardia: Secondary | ICD-10-CM

## 2015-12-22 NOTE — Progress Notes (Signed)
ELECTROPHYSIOLOGY CONSULT NOTE  Patient ID: Benjamin Norman, MRN: [id number], DOB/AGE: 03-Feb-1976 40 y.o. Admit date: (Not on file) Date of Consult: 12/22/2015  Primary Physician: Ria Bush, MD Primary Cardiologist: AI Consulting Physician AI  Chief Complaint: SVT   HPI Benjamin Norman is a 40 y.o. male with  a 13 year  history of abrupt onset-offset palpitations.  They occur 2  times per year.  They are associated with No sob, No cp, [place],  Nopresyncope or  Nosyncope.  They are frog Negative  and diuretic Negative. They are  aggravated by caffeine, exertion, bending over.  Echocardiogram demonstrated normal LV function  He has no history of exercise intolerance      Past Medical History  Diagnosis Date  . Childhood asthma   . 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 Fever blister   . Other kyphosis (acquired)     thoracic  . Unspecified tinnitus   . 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 Heart murmur     as child      Surgical History:  Past Surgical History  Procedure Laterality Date  . Mohs surgery       Home Meds: Prior to Admission medications   Medication Sig Start Date End Date Taking? Authorizing Provider  cetirizine (ZYRTEC) 5 MG tablet Take 5 mg by mouth daily.   Yes Historical Provider, MD  mineral oil-hydrophilic petrolatum (AQUAPHOR) ointment Apply 1 application topically as needed. Reported on 10/19/2015   Yes Historical Provider, MD  metoprolol tartrate (LOPRESSOR) 25 MG tablet Take 1 tablet (25 mg total) by mouth 2 (two) times daily. Patient not taking: Reported on 12/22/2015 12/16/15   Wende Bushy, MD    Allergies:  Allergies  Allergen Reactions  . Theophyllines Nausea And Vomiting    As teen    Social History   Social History  . Marital Status: Married    Spouse Name: N/A  . Number of Children: 2  . Years of Education: N/A   Occupational History  . Librarian, academic   .  Youth pastor    Social History Main Topics  . Smoking status: Never Smoker   . Smokeless tobacco: Never Used  . Alcohol Use: No  . Drug Use: No  . Sexual Activity: Not on file   Other Topics Concern  . 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 Hypertension Mother   . Hyperlipidemia Mother   . Healthy Father   . Healthy Brother   . Coronary artery disease Paternal Grandfather   . Alcohol abuse Paternal Grandfather   . Heart attack Paternal Grandfather   . Cancer Neg Hx   . Stroke Neg Hx   . Diabetes Neg Hx      ROS:  Please see the history of present illness.     All other systems reviewed and negative.    Physical Exam   Blood pressure 108/78, pulse 59, height 5\' 10"  (1.778 m), weight 189 lb (85.73 kg). General: Well developed, well nourished male in no acute distress. Head: Normocephalic, atraumatic, sclera non-icteric, no xanthomas, nares are  without discharge. EENT: normal  Lymph Nodes:  none Neck: Negative for carotid bruits. JVD not elevated. Back:without scoliosis kyphosis  Lungs: Clear bilaterally to auscultation without wheezes, rales, or rhonchi. Breathing is unlabored. Heart: RRR with S1 S2. No murmur . No rubs, or gallops appreciated. Abdomen: Soft, non-tender, non-distended with normoactive bowel sounds. No hepatomegaly. No rebound/guarding. No obvious abdominal masses. Msk:  Strength and tone appear normal for * edema.  Distal pedal pulses are 2+ and equal bilaterally. Skin: Warm and Dry Neuro: Alert and oriented X 3. CN III-XII intact Grossly normal sensory and motor function . Psych:  Responds to questions appropriately with a normal affect.      Labs: Cardiac Enzymes No results for  input(s): CKTOTAL, CKMB, TROPONINI in the last 72 hours. CBC Lab Results  Component Value Date   WBC 5.3 10/19/2015   HGB 16.9 10/19/2015   HCT 50.1 10/19/2015   MCV 86.1 10/19/2015   PLT 183.0 10/19/2015   PROTIME: No results for input(s): LABPROT, INR in the last 72 hours. Chemistry No results for input(s): NA, K, CL, CO2, BUN, CREATININE, CALCIUM, PROT, BILITOT, ALKPHOS, ALT, AST, GLUCOSE in the last 168 hours.  Invalid input(s): LABALBU Lipids Lab Results  Component Value Date   CHOL 162 01/28/2014   HDL 53.30 01/28/2014   LDLCALC 95 01/28/2014   TRIG 67.0 01/28/2014   BNP No results found for: PROBNP Thyroid Function Tests: No results for input(s): TSH, T4TOTAL, T3FREE, THYROIDAB in the last 72 hours.  Invalid input(s): FREET3 Miscellaneous No results found for: DDIMER  Radiology/Studies:  No results found.  EKG:  Sinus rhythm at 59 14/08/41 While the PR interval is short there is no evidence of ventricular preexcitation  Previous ECGs were reviewed. In none of them that I see evidence of ventricular preexcitation. One of them had a PR interval 145 ms.  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 There is no evidence of ventricular preexcitation. Hence, therapeutic choices related to lifestyle choices. These episodes have been relatively infrequent and relatively well-tolerated. Anxiety is a major component  At this point he would like to continue management expectantly, and I have in this regard reviewed with him carotid sinus massage and we have given him a prescription for metoprolol to take in the event that he has recurrent tachycardia as it might help to improve the effectiveness of vagal maneuvers     Benjamin Norman  I

## 2015-12-22 NOTE — Patient Instructions (Signed)
Medication Instructions: - Your physician recommends that you continue on your current medications as directed. Please refer to the Current Medication list given to you today.  Labwork: - none  Procedures/Testing: - none  Follow-Up: - Your physician wants you to follow-up in: 1 year with Dr. Caryl Comes. You will receive a reminder letter in the mail two months in advance. If you don't receive a letter, please call our office to schedule the follow-up appointment.  Any Additional Special Instructions Will Be Listed Below (If Applicable). Dr. Ermalene Searing- [phone]    If you need a refill on your cardiac medications before your next appointment, please call your pharmacy.

## 2015-12-27 ENCOUNTER — Ambulatory Visit: Payer: Managed Care, Other (non HMO) | Admitting: Cardiology

## 2016-07-23 ENCOUNTER — Ambulatory Visit (INDEPENDENT_AMBULATORY_CARE_PROVIDER_SITE_OTHER): Payer: Managed Care, Other (non HMO) | Admitting: Family Medicine

## 2016-07-23 ENCOUNTER — Encounter: Payer: Self-pay | Admitting: Family Medicine

## 2016-07-23 DIAGNOSIS — J069 Acute upper respiratory infection, unspecified: Secondary | ICD-10-CM

## 2016-07-23 MED ORDER — ALBUTEROL SULFATE HFA 108 (90 BASE) MCG/ACT IN AERS: 1.0000 | INHALATION_SPRAY | Freq: Four times a day (QID) | RESPIRATORY_TRACT | 0 refills | Status: DC | PRN

## 2016-07-23 MED ORDER — METOPROLOL TARTRATE 25 MG PO TABS: 25.0000 mg | ORAL_TABLET | Freq: Two times a day (BID) | ORAL | Status: DC | PRN

## 2016-07-23 NOTE — Patient Instructions (Signed)
Likely not the flu but probably another virus.   Rest and fluids, try the inhaler with holding your breath in for 5 seconds with 1 pump.   See if that helps.  Take care.  Glad to see you.  Update [place] as needed.

## 2016-07-23 NOTE — Progress Notes (Signed)
Pre visit review using our clinic review tool, if applicable. No additional management support is needed unless otherwise documented below in the visit note. 

## 2016-07-23 NOTE — Progress Notes (Signed)
He has had mult family members in the hospital with mult illnesses.  D/w pt.    He was never on metoprolol.  Hasn't used med in the meantime.    Sx started in the last few days.  He didn't feel like he was getting a deep breath.  Sx worse at night usually.  He used SABA w/o a lot of effect.  He noted some wheeze today. Some cough.  Some scant sputum.  No fevers.  No vomiting, no diarrhea.  His ears have felt hot.  He has felt hot/cold occ.    In the past 6-12 months, he has noted some changes in breathing right before he would go on to get sick.    H/o asthma in the past.    Meds, vitals, and allergies reviewed.   ROS: Per HPI unless specifically indicated in ROS section   GEN: nad, alert and oriented HEENT: mucous membranes moist, tm w/o erythema, nasal exam w/o erythema, clear discharge noted,  OP with cobblestoning NECK: supple w/o LA CV: rrr.   PULM: ctab, no inc wob EXT: no edema

## 2016-07-24 DIAGNOSIS — J069 Acute upper respiratory infection, unspecified: Secondary | ICD-10-CM | POA: Insufficient documentation

## 2016-07-24 NOTE — Assessment & Plan Note (Signed)
Likely not the flu but probably another virus.   Discussed with patient, nontoxic. Okay for outpatient follow-up. He may not be getting enough effect from albuterol since he was not holding the medication in with use.   D/w pt about rest and fluids, try the inhaler with holding your breath in for 5 seconds with 1 pump. Update [place] as needed.   He agrees.

## 2016-08-03 ENCOUNTER — Encounter: Payer: Self-pay | Admitting: Family Medicine

## 2016-08-03 ENCOUNTER — Ambulatory Visit (INDEPENDENT_AMBULATORY_CARE_PROVIDER_SITE_OTHER): Payer: Managed Care, Other (non HMO) | Admitting: Family Medicine

## 2016-08-03 VITALS — BP 118/70 | HR 64 | Temp 98.0°F | Ht 70.5 in | Wt 200.5 lb

## 2016-08-03 DIAGNOSIS — R5383 Other fatigue: Secondary | ICD-10-CM | POA: Diagnosis not present

## 2016-08-03 DIAGNOSIS — Z0001 Encounter for general adult medical examination with abnormal findings: Secondary | ICD-10-CM | POA: Diagnosis not present

## 2016-08-03 DIAGNOSIS — H4911 Fourth [trochlear] nerve palsy, right eye: Secondary | ICD-10-CM

## 2016-08-03 DIAGNOSIS — Z0000 Encounter for general adult medical examination without abnormal findings: Secondary | ICD-10-CM

## 2016-08-03 LAB — BASIC METABOLIC PANEL
BUN: 11 mg/dL (ref 6–23)
CHLORIDE: 104 meq/L (ref 96–112)
CO2: 29 meq/L (ref 19–32)
Calcium: 9.8 mg/dL (ref 8.4–10.5)
Creatinine, Ser: 0.82 mg/dL (ref 0.40–1.50)
GFR: 110.17 mL/min (ref >60.00)
Glucose, Bld: 91 mg/dL (ref 70–99)
POTASSIUM: 4.5 meq/L (ref 3.5–5.1)
SODIUM: 139 meq/L (ref 135–145)

## 2016-08-03 LAB — CBC WITH DIFFERENTIAL/PLATELET
Basophils Absolute: 0 10*3/uL (ref 0.0–0.1)
Basophils Relative: 0.7 % (ref 0.0–3.0)
EOS PCT: 2.4 % (ref 0.0–5.0)
Eosinophils Absolute: 0.1 10*3/uL (ref 0.0–0.7)
HCT: 48.2 % (ref 39.0–52.0)
Hemoglobin: 16.7 g/dL (ref 13.0–17.0)
LYMPHS ABS: 1.4 10*3/uL (ref 0.7–4.0)
Lymphocytes Relative: 33.2 % (ref 12.0–46.0)
MCHC: 34.7 g/dL (ref 30.0–36.0)
MCV: 87.1 fl (ref 78.0–100.0)
MONOS PCT: 8.7 % (ref 3.0–12.0)
Monocytes Absolute: 0.4 10*3/uL (ref 0.1–1.0)
NEUTROS PCT: 55 % (ref 43.0–77.0)
Neutro Abs: 2.3 10*3/uL (ref 1.4–7.7)
Platelets: 192 10*3/uL (ref 150.0–400.0)
RBC: 5.53 Mil/uL (ref 4.22–5.81)
RDW: 13.2 % (ref 11.5–15.5)
WBC: 4.2 10*3/uL (ref 4.0–10.5)

## 2016-08-03 LAB — VITAMIN B12: VITAMIN B 12: 416 pg/mL (ref 211–911)

## 2016-08-03 LAB — TSH: TSH: 0.99 u[IU]/mL (ref 0.35–4.50)

## 2016-08-03 LAB — FOLATE: Folate: 20.5 ng/mL (ref >5.9)

## 2016-08-03 NOTE — Progress Notes (Signed)
BP 118/70   Pulse 64   Temp 98 F (36.7 C) (Oral)   Ht 5' 10.5" (1.791 m)   Wt 200 lb 8 oz (90.9 kg)   BMI 28.36 kg/m    CC: CPE Subjective:    Patient ID: Benjamin Norman, male    DOB: [birth year], 41 y.o.   MRN: [id number]  HPI: Benjamin Norman is a 41 y.o. male presenting on 08/03/2016 for Annual Exam   Seen last week for URI saw Dr Damita Dunnings. Some dyspnea. No improvement with albuterol inhaler.   Planning spartan race 09/2016. Noticing decreased energy and stamina. Wants to be evaluated for fatigue. Easily naps during the day. No known snoring or witnessed apneic episodes or PNdyspnea. Feels rested at night. Denies depression. Libido ok.   Never took metoprolol - prescribed for AV re entry SVT. Has for PRN.   Preventative: Flu shot at work.  Td 2009  Seat belt use discussed. Sunscreen use discussed. Regularly sees dermatologist Allyson Sabal) for h/o atypical nevi.  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  Diet: good water, fruits/vegetables daily  Relevant past medical, surgical, family and social history reviewed and updated as indicated. Interim medical history since our last visit reviewed. Allergies and medications reviewed and updated. Outpatient Medications Prior to Visit  Medication Sig Dispense Refill  . albuterol (PROVENTIL HFA;VENTOLIN HFA) 108 (90 Base) MCG/ACT inhaler Inhale 1-2 puffs into the lungs every 6 (six) hours as needed for wheezing or shortness of breath (or for cough). 1 Inhaler 0  . cetirizine (ZYRTEC) 5 MG tablet Take 5 mg by mouth daily.    . mineral oil-hydrophilic petrolatum (AQUAPHOR) ointment Apply 1 application topically as needed. Reported on 10/19/2015    . metoprolol tartrate (LOPRESSOR) 25 MG tablet Take 1 tablet (25 mg total) by mouth 2 (two) times daily as needed. (Patient  not taking: Reported on 07/23/2016)     No facility-administered medications prior to visit.      Per HPI unless specifically indicated in ROS section below Review of Systems  Constitutional: Negative for activity change, appetite change, chills, fatigue, fever and unexpected weight change.  HENT: Negative for hearing loss.   Eyes: Negative for visual disturbance.  Respiratory: Positive for shortness of breath (recent URI). Negative for cough, chest tightness and wheezing.   Cardiovascular: Negative for chest pain, palpitations and leg swelling.  Gastrointestinal: Negative for abdominal distention, abdominal pain, blood in stool, constipation, diarrhea, nausea and vomiting.  Genitourinary: Negative for difficulty urinating and hematuria.  Musculoskeletal: Negative for arthralgias, myalgias and neck pain.  Skin: Negative for rash.  Neurological: Positive for headaches (occasional). Negative for dizziness, seizures and syncope.  Hematological: Negative for adenopathy. Does not bruise/bleed easily.  Psychiatric/Behavioral: Negative for dysphoric mood. The patient is not nervous/anxious.        Objective:    BP 118/70   Pulse 64   Temp 98 F (36.7 C) (Oral)   Ht 5' 10.5" (1.791 m)   Wt 200 lb 8 oz (90.9 kg)   BMI 28.36 kg/m   Wt Readings from Last 3 Encounters:  08/03/16 200 lb 8 oz (90.9 kg)  07/23/16 203 lb (92.1 kg)  12/22/15 189 lb (85.7 kg)    Physical Exam  Constitutional: He is oriented to person, place, and time. He appears well-developed and well-nourished.  No distress.  HENT:  Head: Normocephalic and atraumatic.  Right Ear: Hearing, tympanic membrane, external ear and ear canal normal.  Left Ear: Hearing, tympanic membrane, external ear and ear canal normal.  Nose: Nose normal.  Mouth/Throat: Uvula is midline, oropharynx is clear and moist and mucous membranes are normal. No oropharyngeal exudate, posterior oropharyngeal edema or posterior oropharyngeal erythema.    Eyes: Conjunctivae and EOM are normal. Pupils are equal, round, and reactive to light. No scleral icterus.  Neck: Normal range of motion. Neck supple. No thyromegaly present.  Cardiovascular: Normal rate, regular rhythm, normal heart sounds and intact distal pulses.   No murmur heard. Pulses:      Radial pulses are 2+ on the right side, and 2+ on the left side.  Pulmonary/Chest: Effort normal and breath sounds normal. No respiratory distress. He has no wheezes. He has no rales.  Abdominal: Soft. Bowel sounds are normal. He exhibits no distension and no mass. There is no tenderness. There is no rebound and no guarding.  Musculoskeletal: Normal range of motion. He exhibits no edema.  Lymphadenopathy:    He has no cervical adenopathy.  Neurological: He is alert and oriented to person, place, and time.  CN grossly intact, station and gait intact  Skin: Skin is warm and dry. No rash noted.  Psychiatric: He has a normal mood and affect. His behavior is normal. Judgment and thought content normal.  Nursing note and vitals reviewed.      Assessment & Plan:   Problem List Items Addressed This Visit    Healthcare maintenance - Primary    Preventative protocols reviewed and updated unless pt declined. Discussed healthy diet and lifestyle.       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        Other Visit Diagnoses    Fatigue, unspecified type       Relevant Orders   Basic metabolic panel   CBC with Differential/Platelet   TSH   Vitamin B12   Folate       Follow up plan: Return in about 1 year (around 08/03/2017) for annual exam, prior fasting for blood work.  Ria Bush, MD

## 2016-08-03 NOTE — Patient Instructions (Addendum)
You are doing well today. Labs today.  Return as needed or in 1 year for next physical.  Health Maintenance, Male A healthy lifestyle and preventive care is important for your health and wellness. Ask your health care provider about what schedule of regular examinations is right for you. What should I know about weight and diet?  Eat a Healthy Diet  Eat plenty of vegetables, fruits, whole grains, low-fat dairy products, and lean protein.  Do not eat a lot of foods high in solid fats, added sugars, or salt. Maintain a Healthy Weight  Regular exercise can help you achieve or maintain a healthy weight. You should:  Do at least 150 minutes of exercise each week. The exercise should increase your heart rate and make you sweat (moderate-intensity exercise).  Do strength-training exercises at least twice a week. Watch Your Levels of Cholesterol and Blood Lipids  Have your blood tested for lipids and cholesterol every 5 years starting at 41 years of age. If you are at high risk for heart disease, you should start having your blood tested when you are 41 years old. You may need to have your cholesterol levels checked more often if:  Your lipid or cholesterol levels are high.  You are older than 41 years of age.  You are at high risk for heart disease. What should I know about cancer screening? Many types of cancers can be detected early and may often be prevented. Lung Cancer  You should be screened every year for lung cancer if:  You are a current smoker who has smoked for at least 30 years.  You are a former smoker who has quit within the past 15 years.  Talk to your health care provider about your screening options, when you should start screening, and how often you should be screened. Colorectal Cancer  Routine colorectal cancer screening usually begins at 41 years of age and should be repeated every 5-10 years until you are 41 years old. You may need to be screened more often if  early forms of precancerous polyps or small growths are found. Your health care provider may recommend screening at an earlier age if you have risk factors for colon cancer.  Your health care provider may recommend using home test kits to check for hidden blood in the stool.  A small camera at the end of a tube can be used to examine your colon (sigmoidoscopy or colonoscopy). This checks for the earliest forms of colorectal cancer. Prostate and Testicular Cancer  Depending on your age and overall health, your health care provider may do certain tests to screen for prostate and testicular cancer.  Talk to your health care provider about any symptoms or concerns you have about testicular or prostate cancer. Skin Cancer  Check your skin from head to toe regularly.  Tell your health care provider about any new moles or changes in moles, especially if:  There is a change in a mole's size, shape, or color.  You have a mole that is larger than a pencil eraser.  Always use sunscreen. Apply sunscreen liberally and repeat throughout the day.  Protect yourself by wearing long sleeves, pants, a wide-brimmed hat, and sunglasses when outside. What should I know about heart disease, diabetes, and high blood pressure?  If you are 80-79 years of age, have your blood pressure checked every 3-5 years. If you are 57 years of age or older, have your blood pressure checked every year. You should have your blood  pressure measured twice-once when you are at a hospital or clinic, and once when you are not at a hospital or clinic. Record the average of the two measurements. To check your blood pressure when you are not at a hospital or clinic, you can use:  An automated blood pressure machine at a pharmacy.  A home blood pressure monitor.  Talk to your health care provider about your target blood pressure.  If you are between 8-35 years old, ask your health care provider if you should take aspirin to prevent  heart disease.  Have regular diabetes screenings by checking your fasting blood sugar level.  If you are at a normal weight and have a low risk for diabetes, have this test once every three years after the age of 109.  If you are overweight and have a high risk for diabetes, consider being tested at a younger age or more often.  A one-time screening for abdominal aortic aneurysm (AAA) by ultrasound is recommended for men aged 54-75 years who are current or former smokers. What should I know about preventing infection? Hepatitis B  If you have a higher risk for hepatitis B, you should be screened for this virus. Talk with your health care provider to find out if you are at risk for hepatitis B infection. Hepatitis C  Blood testing is recommended for:  Everyone born from [birth year] through 1965.  Anyone with known risk factors for hepatitis C. Sexually Transmitted Diseases (STDs)  You should be screened each year for STDs including gonorrhea and chlamydia if:  You are sexually active and are younger than 41 years of age.  You are older than 41 years of age and your health care provider tells you that you are at risk for this type of infection.  Your sexual activity has changed since you were last screened and you are at an increased risk for chlamydia or gonorrhea. Ask your health care provider if you are at risk.  Talk with your health care provider about whether you are at high risk of being infected with HIV. Your health care provider may recommend a prescription medicine to help prevent HIV infection. What else can I do?  Schedule regular health, dental, and eye exams.  Stay current with your vaccines (immunizations).  Do not use any tobacco products, such as cigarettes, chewing tobacco, and e-cigarettes. If you need help quitting, ask your health care provider.  Limit alcohol intake to no more than 2 drinks per day. One drink equals 12 ounces of beer, 5 ounces of wine, or 1 ounces  of hard liquor.  Do not use street drugs.  Do not share needles.  Ask your health care provider for help if you need support or information about quitting drugs.  Tell your health care provider if you often feel depressed.  Tell your health care provider if you have ever been abused or do not feel safe at home. This information is not intended to replace advice given to you by your health care provider. Make sure you discuss any questions you have with your health care provider. Document Released: 11/17/2007 Document Revised: 01/18/2016 Document Reviewed: 02/22/2015 Elsevier Interactive Patient Education  2017 [employer].

## 2016-08-03 NOTE — Assessment & Plan Note (Signed)
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

## 2016-08-03 NOTE — Assessment & Plan Note (Signed)
Preventative protocols reviewed and updated unless pt declined. Discussed healthy diet and lifestyle.  

## 2016-08-03 NOTE — Progress Notes (Signed)
Pre visit review using our clinic review tool, if applicable. No additional management support is needed unless otherwise documented below in the visit note. 

## 2016-08-21 ENCOUNTER — Encounter: Payer: Self-pay | Admitting: Internal Medicine

## 2016-08-21 ENCOUNTER — Ambulatory Visit (INDEPENDENT_AMBULATORY_CARE_PROVIDER_SITE_OTHER): Payer: Managed Care, Other (non HMO) | Admitting: Internal Medicine

## 2016-08-21 VITALS — BP 116/82 | HR 98 | Temp 98.5°F | Resp 24 | Wt 196.0 lb

## 2016-08-21 DIAGNOSIS — J029 Acute pharyngitis, unspecified: Secondary | ICD-10-CM | POA: Diagnosis not present

## 2016-08-21 LAB — POCT RAPID STREP A (OFFICE): RAPID STREP A SCREEN: NEGATIVE

## 2016-08-21 NOTE — Patient Instructions (Signed)
Please let me know if you are worsening in the next few days.

## 2016-08-21 NOTE — Addendum Note (Signed)
Addended by: Pilar Grammes on: 08/21/2016 10:32 AM   Modules accepted: Orders

## 2016-08-21 NOTE — Progress Notes (Signed)
Subjective:    Patient ID: Benjamin Norman, male    DOB: November 28, 1975, 41 y.o.   MRN: [id number]  HPI Here due to respiratory illness Sick for about a week--didn't start out very severe Had improved by several days later---then worsened 3 days ago Off and on worsening  Last night--"my throat felt like fire" Some cough--feels it in bronchial tubes Some sputum Low grade fever at 3AM--- 99.6. Getting hot and cold Some myalgias--mostly 3 days ago No SOB No ear pain  Tried sudafed for severe rhinorrhea at first Ibuprofen for the aches, etc Also tried Goody's--got headache from the sudafed  Current Outpatient Prescriptions on File Prior to Visit  Medication Sig Dispense Refill  . albuterol (PROVENTIL HFA;VENTOLIN HFA) 108 (90 Base) MCG/ACT inhaler Inhale 1-2 puffs into the lungs every 6 (six) hours as needed for wheezing or shortness of breath (or for cough). 1 Inhaler 0  . cetirizine (ZYRTEC) 5 MG tablet Take 5 mg by mouth daily.    . mineral oil-hydrophilic petrolatum (AQUAPHOR) ointment Apply 1 application topically as needed. Reported on 10/19/2015    . metoprolol tartrate (LOPRESSOR) 25 MG tablet Take 1 tablet (25 mg total) by mouth 2 (two) times daily as needed. (Patient not taking: Reported on 07/23/2016)     No current facility-administered medications on file prior to visit.     Allergies  Allergen Reactions  . Theophyllines Nausea And Vomiting    As teen    Past Medical History:  Diagnosis Date  . 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 Childhood asthma   . Fever blister   . Heart murmur    as child  . Other kyphosis (acquired)    thoracic  . SVT (supraventricular tachycardia) ([place])    AV re entry Caryl Comes)  . 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 Unspecified tinnitus     Past Surgical History:  Procedure Laterality Date  . MOHS SURGERY      Family History  Problem Relation Age of Onset  . Hypertension Mother     . Hyperlipidemia Mother   . Healthy Father   . Healthy Brother   . Coronary artery disease Paternal Grandfather   . Alcohol abuse Paternal Grandfather   . Heart attack Paternal Grandfather   . Cancer Neg Hx   . Stroke Neg Hx   . Diabetes Neg Hx     Social History   Social History  . Marital status: Married    Spouse name: N/A  . Number of children: 2  . Years of education: N/A   Occupational History  . Librarian, academic   . Youth pastor    Social History Main Topics  . Smoking status: Never Smoker  . Smokeless tobacco: Never Used  . Alcohol use No  . Drug use: No  . Sexual activity: Not on file   Other Topics Concern  . 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 No distress.  HENT:  No sinus tenderness TMs normal Mild  nasal inflammation Pharynx very injected but no enlarged tonsils or exudate  Neck: Neck supple. No thyromegaly present.  Pulmonary/Chest: Effort normal and breath sounds normal. No respiratory distress. He has no wheezes. He has no rales.  Lymphadenopathy:    He has no cervical adenopathy.          Assessment & Plan:

## 2016-08-21 NOTE — Progress Notes (Signed)
Pre visit review using our clinic review tool, if applicable. No additional management support is needed unless otherwise documented below in the visit note. 

## 2016-08-21 NOTE — Assessment & Plan Note (Addendum)
Likely viral but does have kids Some element of bronchitis as well---discussed that this is usually viral Rapid strep to be sure----negative Discussed supportive care If worse later in the week, would consider empiric antibiotic

## 2017-06-27 ENCOUNTER — Encounter: Payer: Self-pay | Admitting: Family Medicine

## 2017-07-23 ENCOUNTER — Encounter: Payer: Self-pay | Admitting: Family Medicine

## 2017-07-23 NOTE — Progress Notes (Signed)
Rapid Strep Test Done 07/20/17, results - Negative.  Mike Craze, CMA

## 2017-07-24 NOTE — Progress Notes (Signed)
Noted. Thanks.  I would defer anything else at this point.  Other family members were reportedly neg on recent/follow up testing.   

## 2017-10-21 ENCOUNTER — Encounter: Payer: Self-pay | Admitting: Family Medicine

## 2018-04-17 ENCOUNTER — Telehealth: Payer: Self-pay | Admitting: *Deleted

## 2018-04-17 NOTE — Telephone Encounter (Signed)
Thank you so much

## 2018-04-17 NOTE — Telephone Encounter (Signed)
Letter written and in Lisa's box.  

## 2018-04-17 NOTE — Telephone Encounter (Signed)
Patient and wife are in the process of adopting a child in January.  The ppw requires a note on the company letterhead stating the information required.  The copy of the information is in your In Box.  Please notify Lugene when this is completed.

## 2019-08-03 DIAGNOSIS — U071 COVID-19: Secondary | ICD-10-CM

## 2019-08-03 HISTORY — DX: COVID-19: U07.1

## 2019-08-09 ENCOUNTER — Other Ambulatory Visit: Payer: Self-pay | Admitting: Family Medicine

## 2019-08-09 DIAGNOSIS — Z131 Encounter for screening for diabetes mellitus: Secondary | ICD-10-CM

## 2019-08-09 DIAGNOSIS — Z13220 Encounter for screening for lipoid disorders: Secondary | ICD-10-CM

## 2019-08-11 ENCOUNTER — Other Ambulatory Visit: Payer: Self-pay

## 2019-08-11 ENCOUNTER — Other Ambulatory Visit (INDEPENDENT_AMBULATORY_CARE_PROVIDER_SITE_OTHER): Payer: Managed Care, Other (non HMO)

## 2019-08-11 DIAGNOSIS — Z13220 Encounter for screening for lipoid disorders: Secondary | ICD-10-CM

## 2019-08-11 DIAGNOSIS — Z131 Encounter for screening for diabetes mellitus: Secondary | ICD-10-CM | POA: Diagnosis not present

## 2019-08-11 LAB — LIPID PANEL
Cholesterol: 169 mg/dL (ref 0–200)
HDL: 47.3 mg/dL (ref >39.00)
LDL Cholesterol: 94 mg/dL (ref 0–99)
NonHDL: 122.11
Total CHOL/HDL Ratio: 4
Triglycerides: 139 mg/dL (ref 0.0–149.0)
VLDL: 27.8 mg/dL (ref 0.0–40.0)

## 2019-08-11 LAB — BASIC METABOLIC PANEL
BUN: 9 mg/dL (ref 6–23)
CO2: 29 mEq/L (ref 19–32)
Calcium: 9.4 mg/dL (ref 8.4–10.5)
Chloride: 105 mEq/L (ref 96–112)
Creatinine, Ser: 0.93 mg/dL (ref 0.40–1.50)
GFR: 88.35 mL/min (ref >60.00)
Glucose, Bld: 88 mg/dL (ref 70–99)
Potassium: 4.1 mEq/L (ref 3.5–5.1)
Sodium: 139 mEq/L (ref 135–145)

## 2019-08-12 ENCOUNTER — Other Ambulatory Visit: Payer: Managed Care, Other (non HMO)

## 2019-08-19 ENCOUNTER — Other Ambulatory Visit: Payer: Self-pay

## 2019-08-19 ENCOUNTER — Ambulatory Visit (INDEPENDENT_AMBULATORY_CARE_PROVIDER_SITE_OTHER): Payer: Managed Care, Other (non HMO) | Admitting: Family Medicine

## 2019-08-19 ENCOUNTER — Encounter: Payer: Self-pay | Admitting: Family Medicine

## 2019-08-19 VITALS — BP 120/76 | HR 88 | Temp 98.2°F | Ht 70.0 in | Wt 222.2 lb

## 2019-08-19 DIAGNOSIS — I471 Supraventricular tachycardia: Secondary | ICD-10-CM

## 2019-08-19 DIAGNOSIS — Z0000 Encounter for general adult medical examination without abnormal findings: Secondary | ICD-10-CM

## 2019-08-19 DIAGNOSIS — E669 Obesity, unspecified: Secondary | ICD-10-CM

## 2019-08-19 DIAGNOSIS — Z23 Encounter for immunization: Secondary | ICD-10-CM

## 2019-08-19 MED ORDER — METOPROLOL TARTRATE 25 MG PO TABS: 25.0000 mg | ORAL_TABLET | Freq: Two times a day (BID) | ORAL | 0 refills | Status: DC | PRN

## 2019-08-19 NOTE — Assessment & Plan Note (Signed)
Reviewed weight gain over the course of the pandemic. Encouraged renewed efforts at healthy diet and lifestyle changes to affect sustainable weight loss.

## 2019-08-19 NOTE — Patient Instructions (Addendum)
Tdap today.  Work on regular exercise routine for goal sustainable weight loss You are doing well today Return as needed or in 1-2 years for next physical.   Health Maintenance, Male Adopting a healthy lifestyle and getting preventive care are important in promoting health and wellness. Ask your health care provider about:  The right schedule for you to have regular tests and exams.  Things you can do on your own to prevent diseases and keep yourself healthy. What should I know about diet, weight, and exercise? Eat a healthy diet   Eat a diet that includes plenty of vegetables, fruits, low-fat dairy products, and lean protein.  Do not eat a lot of foods that are high in solid fats, added sugars, or sodium. Maintain a healthy weight Body mass index (BMI) is a measurement that can be used to identify possible weight problems. It estimates body fat based on height and weight. Your health care provider can help determine your BMI and help you achieve or maintain a healthy weight. Get regular exercise Get regular exercise. This is one of the most important things you can do for your health. Most adults should:  Exercise for at least 150 minutes each week. The exercise should increase your heart rate and make you sweat (moderate-intensity exercise).  Do strengthening exercises at least twice a week. This is in addition to the moderate-intensity exercise.  Spend less time sitting. Even light physical activity can be beneficial. Watch cholesterol and blood lipids Have your blood tested for lipids and cholesterol at 44 years of age, then have this test every 5 years. You may need to have your cholesterol levels checked more often if:  Your lipid or cholesterol levels are high.  You are older than 44 years of age.  You are at high risk for heart disease. What should I know about cancer screening? Many types of cancers can be detected early and may often be prevented. Depending on your  health history and family history, you may need to have cancer screening at various ages. This may include screening for:  Colorectal cancer.  Prostate cancer.  Skin cancer.  Lung cancer. What should I know about heart disease, diabetes, and high blood pressure? Blood pressure and heart disease  High blood pressure causes heart disease and increases the risk of stroke. This is more likely to develop in people who have high blood pressure readings, are of African descent, or are overweight.  Talk with your health care provider about your target blood pressure readings.  Have your blood pressure checked: ? Every 3-5 years if you are 20-73 years of age. ? Every year if you are 27 years old or older.  If you are between the ages of 89 and 41 and are a current or former smoker, ask your health care provider if you should have a one-time screening for abdominal aortic aneurysm (AAA). Diabetes Have regular diabetes screenings. This checks your fasting blood sugar level. Have the screening done:  Once every three years after age 18 if you are at a normal weight and have a low risk for diabetes.  More often and at a younger age if you are overweight or have a high risk for diabetes. What should I know about preventing infection? Hepatitis B If you have a higher risk for hepatitis B, you should be screened for this virus. Talk with your health care provider to find out if you are at risk for hepatitis B infection. Hepatitis C Blood testing  is recommended for:  Everyone born from [birth year] through 1965.  Anyone with known risk factors for hepatitis C. Sexually transmitted infections (STIs)  You should be screened each year for STIs, including gonorrhea and chlamydia, if: ? You are sexually active and are younger than 44 years of age. ? You are older than 44 years of age and your health care provider tells you that you are at risk for this type of infection. ? Your sexual activity has changed  since you were last screened, and you are at increased risk for chlamydia or gonorrhea. Ask your health care provider if you are at risk.  Ask your health care provider about whether you are at high risk for HIV. Your health care provider may recommend a prescription medicine to help prevent HIV infection. If you choose to take medicine to prevent HIV, you should first get tested for HIV. You should then be tested every 3 months for as long as you are taking the medicine. Follow these instructions at home: Lifestyle  Do not use any products that contain nicotine or tobacco, such as cigarettes, e-cigarettes, and chewing tobacco. If you need help quitting, ask your health care provider.  Do not use street drugs.  Do not share needles.  Ask your health care provider for help if you need support or information about quitting drugs. Alcohol use  Do not drink alcohol if your health care provider tells you not to drink.  If you drink alcohol: ? Limit how much you have to 0-2 drinks a day. ? Be aware of how much alcohol is in your drink. In the [place] one drink equals one 12 oz bottle of beer (355 mL), one 5 oz glass of wine (148 mL), or one 1 oz glass of hard liquor (44 mL). General instructions  Schedule regular health, dental, and eye exams.  Stay current with your vaccines.  Tell your health care provider if: ? You often feel depressed. ? You have ever been abused or do not feel safe at home. Summary  Adopting a healthy lifestyle and getting preventive care are important in promoting health and wellness.  Follow your health care provider's instructions about healthy diet, exercising, and getting tested or screened for diseases.  Follow your health care provider's instructions on monitoring your cholesterol and blood pressure. This information is not intended to replace advice given to you by your health care provider. Make sure you discuss any questions you have with your health care  provider. Document Revised: 05/14/2018 Document Reviewed: 05/14/2018 Elsevier Patient Education  2020 [employer].

## 2019-08-19 NOTE — Progress Notes (Signed)
This visit was conducted in person.  BP 120/76 (BP Location: Left Arm, Patient Position: Sitting, Cuff Size: Large)   Pulse 88   Temp 98.2 F (36.8 C) (Temporal)   Ht 5\' 10"  (1.778 m)   Wt 222 lb 3 oz (100.8 kg)   SpO2 97%   BMI 31.88 kg/m    CC: CPE Subjective:    Patient ID: Benjamin Norman, male    DOB: February 18, 1976, 44 y.o.   MRN: [id number]  HPI: Benjamin Norman is a 44 y.o. male presenting on 08/19/2019 for Annual Exam   Last seen 08/2016.  H/o AV re entry SVT previously prescribed metoprolol PRN but doesn't use. Manages with cold shower.  Known congenital CN 4 palsy (right).   Increased stress the last few years.  44 yo broke tib/fib last month - healing well.  Grandmother and uncle passed away this year.  Dog deceased this year.   Over last few months R eyelid twitching.  Not as active as he previously was. Low energy. 25+ lb weight gain over the past year.   Preventative: Flu shot at work.  Td 2009. Tdap - today Seat belt use discussed. Sunscreen use discussed. Sees dermatologist yearly for h/o atypical nevi.  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 Interim medical history since our last visit reviewed. Allergies and medications reviewed and updated. Outpatient Medications Prior to Visit  Medication Sig Dispense Refill  . cetirizine (ZYRTEC) 5 MG tablet Take 5 mg by mouth daily.    . Cholecalciferol (VITAMIN D3 GUMMIES PO) Take by mouth daily.    . Multiple Vitamin (MULTIVITAMINS PO) Take by mouth daily.    Marland Kitchen albuterol (PROVENTIL HFA;VENTOLIN HFA) 108 (90 Base) MCG/ACT inhaler Inhale 1-2 puffs into  the lungs every 6 (six) hours as needed for wheezing or shortness of breath (or for cough). 1 Inhaler 0  . metoprolol tartrate (LOPRESSOR) 25 MG tablet Take 1 tablet (25 mg total) by mouth 2 (two) times daily as needed. (Patient not taking: Reported on 07/23/2016)    . mineral oil-hydrophilic petrolatum (AQUAPHOR) ointment Apply 1 application topically as needed. Reported on 10/19/2015     No facility-administered medications prior to visit.     Per HPI unless specifically indicated in ROS section below Review of Systems  Constitutional: Negative for activity change, appetite change, chills, fatigue, fever and unexpected weight change.  HENT: Negative for hearing loss.   Eyes: Negative for visual disturbance.  Respiratory: Negative for cough, chest tightness, shortness of breath and wheezing.   Cardiovascular: Positive for palpitations (rare). Negative for chest pain and leg swelling.  Gastrointestinal: Negative for abdominal distention, abdominal pain, blood in stool, constipation, diarrhea, nausea and vomiting.  Genitourinary: Negative for difficulty urinating and hematuria.  Musculoskeletal: Negative for arthralgias, myalgias and neck pain.  Skin: Negative for rash.  Neurological: Negative for dizziness, seizures, syncope and headaches.  Hematological: Negative for adenopathy. Does not bruise/bleed easily.  Psychiatric/Behavioral: Negative for dysphoric mood. The patient is not nervous/anxious.    Objective:    BP 120/76 (BP Location: Left Arm, Patient Position: Sitting, Cuff Size: Large)   Pulse 88  Temp 98.2 F (36.8 C) (Temporal)   Ht 5\' 10"  (1.778 m)   Wt 222 lb 3 oz (100.8 kg)   SpO2 97%   BMI 31.88 kg/m   Wt Readings from Last 3 Encounters:  08/19/19 222 lb 3 oz (100.8 kg)  08/21/16 196 lb (88.9 kg)  08/03/16 200 lb 8 oz (90.9 kg)    Physical Exam Vitals and nursing note reviewed.  Constitutional:      General: He is not in acute distress.    Appearance: Normal  appearance. He is well-developed. He is not ill-appearing.  HENT:     Head: Normocephalic and atraumatic.     Right Ear: Hearing, tympanic membrane, ear canal and external ear normal.     Left Ear: Hearing, tympanic membrane, ear canal and external ear normal.     Mouth/Throat:     Pharynx: Uvula midline.  Eyes:     General: No scleral icterus.    Conjunctiva/sclera: Conjunctivae normal.     Pupils: Pupils are equal, round, and reactive to light.  Cardiovascular:     Rate and Rhythm: Normal rate and regular rhythm.     Pulses: Normal pulses.          Radial pulses are 2+ on the right side and 2+ on the left side.     Heart sounds: Normal heart sounds. No murmur.  Pulmonary:     Effort: Pulmonary effort is normal. No respiratory distress.     Breath sounds: Normal breath sounds. No wheezing, rhonchi or rales.  Abdominal:     General: Abdomen is flat. Bowel sounds are normal. There is no distension.     Palpations: Abdomen is soft. There is no mass.     Tenderness: There is no abdominal tenderness. There is no guarding or rebound.     Hernia: No hernia is present.  Musculoskeletal:        General: Normal range of motion.     Cervical back: Normal range of motion and neck supple.     Right lower leg: No edema.     Left lower leg: No edema.  Lymphadenopathy:     Cervical: No cervical adenopathy.  Skin:    General: Skin is warm and dry.     Findings: No rash.  Neurological:     General: No focal deficit present.     Mental Status: He is alert and oriented to person, place, and time.     Comments: CN grossly intact, station and gait intact  Psychiatric:        Mood and Affect: Mood normal.        Behavior: Behavior normal.        Thought Content: Thought content normal.        Judgment: Judgment normal.       Results for orders placed or performed in visit on [id number]  Basic metabolic panel  Result Value Ref Range   Sodium 139 135 - 145 mEq/L   Potassium 4.1 3.5 - 5.1  mEq/L   Chloride 105 96 - 112 mEq/L   CO2 29 19 - 32 mEq/L   Glucose, Bld 88 70 - 99 mg/dL   BUN 9 6 - 23 mg/dL   Creatinine, Ser 0.93 0.40 - 1.50 mg/dL   GFR 88.35 >60.00 mL/min   Calcium 9.4 8.4 - 10.5 mg/dL  Lipid panel  Result Value Ref Range   Cholesterol 169 0 - 200 mg/dL   Triglycerides 139.0 0.0 - 149.0 mg/dL  HDL 47.30 >39.00 mg/dL   VLDL 27.8 0.0 - 40.0 mg/dL   LDL Cholesterol 94 0 - 99 mg/dL   Total CHOL/HDL Ratio 4    NonHDL 122.11    Assessment & Plan:  This visit occurred during the SARS-CoV-2 public health emergency.  Safety protocols were in place, including screening questions prior to the visit, additional usage of staff PPE, and extensive cleaning of exam room while observing appropriate contact time as indicated for disinfecting solutions.   Problem List Items Addressed This Visit    SVT (supraventricular tachycardia) ([place])    Rare episodes. Thought AV re-entry. Updated metoprolol Rx - he does not use.       Relevant Medications   metoprolol tartrate (LOPRESSOR) 25 MG tablet   Obesity, Class I, BMI 30-34.9    Reviewed weight gain over the course of the pandemic. Encouraged renewed efforts at healthy diet and lifestyle changes to affect sustainable weight loss.       Healthcare maintenance - Primary    Preventative protocols reviewed and updated unless pt declined. Discussed healthy diet and lifestyle.        Other Visit Diagnoses    Need for Tdap vaccination       Relevant Orders   Tdap vaccine greater than or equal to 7yo IM (Completed)       Meds ordered this encounter  Medications  . metoprolol tartrate (LOPRESSOR) 25 MG tablet    Sig: Take 1 tablet (25 mg total) by mouth 2 (two) times daily as needed.    Dispense:  20 tablet    Refill:  0   Orders Placed This Encounter  Procedures  . Tdap vaccine greater than or equal to 7yo IM   Patient instructions: Tdap today.  Work on regular exercise routine for goal sustainable weight loss You  are doing well today Return as needed or in 1-2 years for next physical.   Follow up plan: Return in about 1 year (around 08/18/2020) for annual exam, prior fasting for blood work.  Ria Bush, MD

## 2019-08-19 NOTE — Assessment & Plan Note (Addendum)
Rare episodes. Thought AV re-entry. Updated metoprolol Rx - he does not use.

## 2019-08-19 NOTE — Assessment & Plan Note (Signed)
Preventative protocols reviewed and updated unless pt declined. Discussed healthy diet and lifestyle.  

## 2019-09-02 DIAGNOSIS — U071 COVID-19: Secondary | ICD-10-CM

## 2019-09-02 HISTORY — DX: COVID-19: U07.1

## 2019-09-08 ENCOUNTER — Encounter: Payer: Self-pay | Admitting: Family Medicine

## 2019-09-08 ENCOUNTER — Telehealth: Payer: Managed Care, Other (non HMO) | Admitting: Physician Assistant

## 2019-09-08 DIAGNOSIS — R059 Cough, unspecified: Secondary | ICD-10-CM

## 2019-09-08 DIAGNOSIS — U071 COVID-19: Secondary | ICD-10-CM | POA: Diagnosis not present

## 2019-09-08 DIAGNOSIS — R05 Cough: Secondary | ICD-10-CM

## 2019-09-08 DIAGNOSIS — R0602 Shortness of breath: Secondary | ICD-10-CM

## 2019-09-08 MED ORDER — BENZONATATE 100 MG PO CAPS: 100.0000 mg | ORAL_CAPSULE | Freq: Three times a day (TID) | ORAL | 0 refills | Status: DC | PRN

## 2019-09-08 MED ORDER — BUDESONIDE 90 MCG/ACT IN AEPB: 1.0000 | INHALATION_SPRAY | Freq: Two times a day (BID) | RESPIRATORY_TRACT | 0 refills | Status: DC

## 2019-09-08 NOTE — Progress Notes (Signed)
Hi Lawarence,  I am sorry you are not feeling well.  Thank you for the details you included in the comment boxes. Those details are very helpful in determining the best course of treatment for you and help [place] to provide the best care.  I will prescribe medication for your cough and shortness of breath.  If your symptoms worsen (shortness of breath at rest, you cannot complete sentences without becoming short of breath, confusion, lightheadedness or near syncope, Oxygen saturation <90%), please go to the Emergency Department.   Based on your presentation I believe you most likely have A cough due to a virus.  This is called viral bronchitis and is best treated by rest, plenty of fluids and control of the cough.  You may use Ibuprofen or Tylenol as directed to help your symptoms.     In addition you may use A prescription cough medication called Tessalon Perles 100mg . You may take 1-2 capsules every 8 hours as needed for your cough.  Use Budesonide inhaler twice daily for 1 week.   From your responses in the eVisit questionnaire you describe inflammation in the upper respiratory tract which is causing a significant cough.  This is commonly called Bronchitis and has four common causes:    Allergies  Viral Infections  Acid Reflux  Bacterial Infection Allergies, viruses and acid reflux are treated by controlling symptoms or eliminating the cause. An example might be a cough caused by taking certain blood pressure medications. You stop the cough by changing the medication. Another example might be a cough caused by acid reflux. Controlling the reflux helps control the cough.  USE OF BRONCHODILATOR ("RESCUE") INHALERS: There is a risk from using your bronchodilator too frequently.  The risk is that over-reliance on a medication which only relaxes the muscles surrounding the breathing tubes can reduce the effectiveness of medications prescribed to reduce swelling and congestion of the tubes themselves.   Although you feel brief relief from the bronchodilator inhaler, your asthma may actually be worsening with the tubes becoming more swollen and filled with mucus.  This can delay other crucial treatments, such as oral steroid medications. If you need to use a bronchodilator inhaler daily, several times per day, you should discuss this with your provider.  There are probably better treatments that could be used to keep your asthma under control.     HOME CARE . Only take medications as instructed by your medical team. . Complete the entire course of an antibiotic. . Drink plenty of fluids and get plenty of rest. . Avoid close contacts especially the very young and the elderly . Cover your mouth if you cough or cough into your sleeve. . Always remember to wash your hands . A steam or ultrasonic humidifier can help congestion.   GET HELP RIGHT AWAY IF: . You develop worsening fever. . You become short of breath . You cough up blood. . Your symptoms persist after you have completed your treatment plan MAKE SURE YOU   Understand these instructions.  Will watch your condition.  Will get help right away if you are not doing well or get worse.  Your e-visit answers were reviewed by a board certified advanced clinical practitioner to complete your personal care plan.  Depending on the condition, your plan could have included both over the counter or prescription medications. If there is a problem please reply  once you have received a response from your provider. Your safety is important to [place].  If  you have drug allergies check your prescription carefully.    You can use MyChart to ask questions about today's visit, request a non-urgent call back, or ask for a work or school excuse for 24 hours related to this e-Visit. If it has been greater than 24 hours you will need to follow up with your provider, or enter a new e-Visit to address those concerns. You will get an e-mail in the next two days  asking about your experience.  I hope that your e-visit has been valuable and will speed your recovery. Thank you for using e-visits.  Greater than 5 minutes, yet less than 10 minutes of time have been spent researching, coordinating and implementing care for this patient today.

## 2019-09-09 ENCOUNTER — Encounter: Payer: Self-pay | Admitting: Family Medicine

## 2019-09-09 MED ORDER — FLOVENT HFA 44 MCG/ACT IN AERO: 2.0000 | INHALATION_SPRAY | Freq: Two times a day (BID) | RESPIRATORY_TRACT | 0 refills | Status: DC

## 2019-09-09 NOTE — Telephone Encounter (Signed)
Noted.  Placed pt on Call Log.

## 2019-09-09 NOTE — Telephone Encounter (Signed)
plz place on covid call list and call tomorrow for update on symptoms.

## 2019-09-09 NOTE — Addendum Note (Signed)
Addended by: Ria Bush on: 09/09/2019 06:59 PM   Modules accepted: Orders

## 2019-09-10 ENCOUNTER — Telehealth: Payer: Self-pay

## 2019-09-10 ENCOUNTER — Other Ambulatory Visit: Payer: Self-pay | Admitting: Physician Assistant

## 2019-09-10 NOTE — Telephone Encounter (Addendum)
Spoke with pt asking for update.  States he is feeling better.  No more fever or body aches.  Still has some fatigue and SOB, which is improving.  Quarantine ended yesterday.  Pt is taking walks- 10 min in AM, then 2- 20 min in the PM yesterday and will today.  Also, pt will pick up Flovent inhaler today.

## 2019-09-10 NOTE — Telephone Encounter (Signed)
Noted! Thank you

## 2019-09-11 ENCOUNTER — Ambulatory Visit: Payer: Managed Care, Other (non HMO) | Admitting: Family Medicine

## 2019-09-11 ENCOUNTER — Telehealth: Payer: Managed Care, Other (non HMO) | Admitting: Family Medicine

## 2019-09-11 NOTE — Telephone Encounter (Signed)
Stacie returned Lisa's call. They're in [place] and wouldn't be able to be here by 4:00.  Stacie said they might try urgent care.

## 2019-09-11 NOTE — Telephone Encounter (Addendum)
Pt canceled virtual visit this morning. covid symptoms started 08/30/2019. 10d quarantine ended 09/09/2019.  plz call for update - what specific symptoms is he having?  May offer in office visit at end of day depending on what symptoms he is having.

## 2019-09-11 NOTE — Telephone Encounter (Signed)
Noted.  FYI to Dr. G. 

## 2019-09-11 NOTE — Telephone Encounter (Addendum)
We can see ongoing shortness of breath for weeks after covid as long as improving each day and no chest pain, should be ok to continue monitoring at home over weekend.  We could check him end of day one day next week (could work him in). If he would feel more comfortable to be evaluated today, would offer 4pm appt, come in through the back door.

## 2019-09-11 NOTE — Telephone Encounter (Signed)
Left message on vm per dpr relaying Dr. Synthia Innocent message.  Pt can be scheduled for OV today at 4:00 or the last appt 1 day next wk.  Pt will need to come to the back of bldg and call when he has arrived.

## 2019-09-11 NOTE — Telephone Encounter (Signed)
Spoke with pt asking about his sxs.  States he still has some SOB but seems to be improving daily.  However, pt just wanted to come in to have lungs checked.

## 2019-09-12 ENCOUNTER — Ambulatory Visit (INDEPENDENT_AMBULATORY_CARE_PROVIDER_SITE_OTHER): Payer: Managed Care, Other (non HMO)

## 2019-09-12 ENCOUNTER — Other Ambulatory Visit: Payer: Self-pay

## 2019-09-12 ENCOUNTER — Ambulatory Visit
Admission: EM | Admit: 2019-09-12 | Discharge: 2019-09-12 | Disposition: A | Discharge status: Home / Self Care | Payer: Managed Care, Other (non HMO)

## 2019-09-12 DIAGNOSIS — R0602 Shortness of breath: Secondary | ICD-10-CM | POA: Diagnosis not present

## 2019-09-12 DIAGNOSIS — R0789 Other chest pain: Secondary | ICD-10-CM

## 2019-09-12 DIAGNOSIS — R0600 Dyspnea, unspecified: Secondary | ICD-10-CM

## 2019-09-12 DIAGNOSIS — U071 COVID-19: Secondary | ICD-10-CM | POA: Diagnosis not present

## 2019-09-12 DIAGNOSIS — R911 Solitary pulmonary nodule: Secondary | ICD-10-CM

## 2019-09-12 MED ORDER — ALBUTEROL SULFATE HFA 108 (90 BASE) MCG/ACT IN AERS: 1.0000 | INHALATION_SPRAY | Freq: Four times a day (QID) | RESPIRATORY_TRACT | 0 refills | Status: DC | PRN

## 2019-09-12 NOTE — ED Provider Notes (Signed)
[facility]    CSN: [id number] Arrival date & time: 09/12/19  1141      History   Chief Complaint Chief Complaint  Patient presents with  . Shortness of Breath    HPI Benjamin Norman is a 44 y.o. male.   HPI  44 year old gentleman presents complaining of chest pain describing it, as ,when it is worse,  as an elephant sitting on his chest, indicating his sternum that radiates in through his back and shortness of breath of 5-day duration.  He was diagnosed with Covid on 09/02/2019 and noticed he had mostly fatigue but very little coughing or shortness of breath.  On Monday he noticed that he was becoming more shortness of breath and was also complaining of retrosternal type pain radiating through to his back mostly after activity.  He has no history of coronary disease other than what sounds like SVT that was treated with metoprolol which he never took.  He was recently prescribed, through an E visit, Flovent and even used his wife's albuterol inhaler last night which did not seem to help.  He denies diaphoresis, nausea or vomiting.  He has no radiation of the pain except through to his back.  States that the pain was bothering him last night as was the shortness of breath.  Family history is significant only for grandfather that died late of heart attack.  He does relate that he does not have the pain daily and has had some good days in between bother some ones.       Past Medical History:  Diagnosis Date  . 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 Childhood asthma   . COVID-19 09/02/2019  . COVID-19 virus infection 08/2019  . Fever blister   . Heart murmur    as child  . Other kyphosis (acquired)    thoracic  . SVT (supraventricular tachycardia) ([place])    AV re entry Caryl Comes)  . 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 Unspecified tinnitus     Patient Active Problem List   Diagnosis Date Noted  . SVT (supraventricular  tachycardia) ([place]) 08/19/2019  . Obesity, Class I, BMI 30-34.9 08/19/2019  . Trochlear nerve palsy, right eye   . Healthcare maintenance 02/09/2011  . NEVUS, ATYPICAL 02/02/2010  . 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 MOHS SURGERY         Home Medications    Prior to Admission medications   Medication Sig Start Date End Date Taking? Authorizing Provider  aspirin EC 81 MG tablet Take 81 mg by mouth daily.   Yes [provider]  cetirizine (ZYRTEC) 5 MG tablet Take 5 mg by mouth daily.   Yes [provider]  Cholecalciferol (VITAMIN D3 GUMMIES PO) Take by mouth daily.   Yes [provider]  fluticasone (FLOVENT HFA) 44 MCG/ACT inhaler Inhale 2 puffs into the lungs in the morning and at bedtime for 7 days. 09/09/19 09/16/19 Yes Ria Bush, MD  Multiple Vitamin (MULTIVITAMINS PO) Take by mouth daily.   Yes [provider]  QUERCETIN PO Take by mouth.   Yes [provider]  vitamin C (ASCORBIC ACID) 250 MG tablet Take 250 mg by mouth daily.   Yes [provider]  zinc gluconate 50 MG tablet Take 50 mg by mouth daily.   Yes [provider]  albuterol (VENTOLIN HFA) 108 (90 Base) MCG/ACT inhaler  Inhale 1-2 puffs into the lungs every 6 (six) hours as needed for wheezing or shortness of breath. Use with spacer 09/12/19   Lorin Picket, PA-C  benzonatate (TESSALON) 100 MG capsule Take 1-2 capsules (100-200 mg total) by mouth 3 (three) times daily as needed for cough. Patient not taking: Reported on 09/12/2019 09/08/19   McVey, Gelene Mink, PA-C  metoprolol tartrate (LOPRESSOR) 25 MG tablet Take 1 tablet (25 mg total) by mouth 2 (two) times daily as needed. Patient not taking: Reported on 09/12/2019 08/19/19   Ria Bush, MD  Budesonide 90 MCG/ACT inhaler Inhale 1 puff into the lungs 2 (two) times daily for 7 days. 09/08/19 09/12/19  McVey, Gelene Mink, PA-C     Family History Family History  Problem Relation Age of Onset  . Hypertension Mother   . Hyperlipidemia Mother   . Healthy Father   . Healthy Brother   . Coronary artery disease Paternal Grandfather   . Alcohol abuse Paternal Grandfather   . Heart attack Paternal Grandfather   . Cancer Neg Hx   . Stroke Neg Hx   . Diabetes Neg Hx     Social History Social History   Tobacco Use  . Smoking status: Never Smoker  . Smokeless tobacco: Never Used  Substance Use Topics  . Alcohol use: No  . Drug use: No     Allergies   Theophyllines   Review of Systems Review of Systems  Constitutional: Positive for activity change. Negative for appetite change, chills, diaphoresis, fatigue and fever.  Respiratory: Positive for cough, chest tightness and shortness of breath.   Cardiovascular: Positive for chest pain and palpitations.  All other systems reviewed and are negative.    Physical Exam Triage Vital Signs ED Triage Vitals  Enc Vitals Group     BP 09/12/19 1201 (!) 139/95     Pulse Rate 09/12/19 1201 91     Resp 09/12/19 1201 18     Temp 09/12/19 1201 98.3 F (36.8 C)     Temp Source 09/12/19 1201 Oral     SpO2 09/12/19 1201 98 %     Weight 09/12/19 1157 210 lb (95.3 kg)     Height 09/12/19 1157 5\' 10"  (1.778 m)     Head Circumference --      Peak Flow --      Pain Score 09/12/19 1155 5     Pain Loc --      Pain Edu? --      Excl. in [place]? --    No data found.  Updated Vital Signs BP (!) 139/95 (BP Location: Left Arm)   Pulse 91   Temp 98.3 F (36.8 C) (Oral)   Resp 18   Ht 5\' 10"  (1.778 m)   Wt 210 lb (95.3 kg)   SpO2 98%   BMI 30.13 kg/m   Visual Acuity Right Eye Distance:   Left Eye Distance:   Bilateral Distance:    Right Eye Near:   Left Eye Near:    Bilateral Near:     Physical Exam Vitals and nursing note reviewed.  Constitutional:      General: He is not in acute distress.    Appearance: He is well-developed. He is obese. He is not  ill-appearing or toxic-appearing.  HENT:     Head: Normocephalic and atraumatic.  Cardiovascular:     Rate and Rhythm: Normal rate and regular rhythm.  Pulmonary:     Effort: Pulmonary effort is normal.  Breath sounds: Normal breath sounds.  Chest:     Chest wall: No tenderness.  Musculoskeletal:        General: Normal range of motion.     Cervical back: Normal range of motion and neck supple.  Skin:    General: Skin is warm and dry.  Neurological:     General: No focal deficit present.     Mental Status: He is alert and oriented to person, place, and time.  Psychiatric:        Mood and Affect: Mood normal.        Behavior: Behavior normal.      UC Treatments / Results  Labs (all labs ordered are listed, but only abnormal results are displayed) Labs Reviewed - No data to display  EKG EKG performed today and interpreted independently by myself shows a ventricular rate of 70 bpm.  PR interval 144 ms, QTC of 408.  Normal sinus rhythm with sinus arrhythmia.  Has normal axis Interpretation: normal sinus rhythm with sinus arrhythmia with no acute changes.  Radiology DG Chest 2 View  Result Date: 09/12/2019 CLINICAL DATA:  Chest pain.  Shortness of breath. EXAM: CHEST - 2 VIEW COMPARISON:  None. FINDINGS: There is a dense nodule in the medial posterior left lung. The heart, hila, mediastinum are normal. No pneumothorax. No other nodules. No masses or infiltrates. IMPRESSION: 1. No acute abnormalities are identified. 2. There is a dense 6 mm nodule in the medial posterior left lower lobe overlapping the spine on the lateral view and the heart on the frontal view. I suspect this nodule is calcified but am not [id number] certain. Recommend comparison to outside films if available. If none are available, recommend a CT scan for complete characterization. Electronically Signed   By: Dorise Bullion III M.D   On: 09/12/2019 13:27    Procedures Procedures (including critical care  time)  Medications Ordered in UC Medications - No data to display  Initial Impression / Assessment and Plan / UC Course  I have reviewed the triage vital signs and the nursing notes.  Pertinent labs & imaging results that were available during my care of the patient were reviewed by me and considered in my medical decision making (see chart for details).   44 year old gentleman presents with complaints of shortness of breath and chest discomfort after he was diagnosed with COVID-19 on 09/02/2019.  He states that the shortness of breath and chest discomfort did not start until Monday of this week 5 days prior to this presentation.  His symptoms were described as substernal chest pain that radiated into his back and when it was worse felt his pressure sensation as if it elephant was on his chest.  Did not have it every day.  Last night was a particularly bad night with the chest pain shortness of breath.  He had no history of coronary artery disease.  He did have a history of SVT which was prescribed metoprolol but he never took it as he was instructed to take it as a rescue type drug to help slow down his heart.  Family history is relatively negative with a grandfather that died of a heart attack at a very late age.  He denies any diaphoresis nausea vomiting or radiation of pain other than into his back.  EKG was normal.  Because of this, troponin was not obtained.  Chest x-ray revealed a dense 6 mm nodule in the medial posterior left lower lobe overlapping the spine radiologist suspected  the nodule was calcified but he was not [id number] certain and recommended that a CT scan be obtained for further delineation.  ReViewed this with the patient.  He will make arrangements through his primary care physician.  I prescribed an albuterol HFA to use for the shortness of breath.  He was told that if he had any worsening of his symptoms or they returned and lasted for any length of time he should call 911 and go to the  emergency room.  Otherwise he will follow up with his primary care at [facility]   Final Clinical Impressions(s) / UC Diagnoses   Final diagnoses:  SOB (shortness of breath)  Dyspnea due to COVID-19  Solitary pulmonary nodule     Discharge Instructions     For shortness of breath we will prescribe the albuterol that you will take along with your Flovent.  Because of the isolated solitary nodule found on chest x-ray today is recommended that you follow-up with your primary care physician for possible CT scan for better delineation.  Please read all the enclosed information I have enclosed for you.    ED Prescriptions    Medication Sig Dispense Auth. Provider   albuterol (VENTOLIN HFA) 108 (90 Base) MCG/ACT inhaler Inhale 1-2 puffs into the lungs every 6 (six) hours as needed for wheezing or shortness of breath. Use with spacer 8 g Lorin Picket, PA-C     PDMP not reviewed this encounter.   Lorin Picket, PA-C 09/12/19 1652

## 2019-09-12 NOTE — Discharge Instructions (Addendum)
For shortness of breath we will prescribe the albuterol that you will take along with your Flovent.  Because of the isolated solitary nodule found on chest x-ray today is recommended that you follow-up with your primary care physician for possible CT scan for better delineation.  Please read all the enclosed information I have enclosed for you.

## 2019-09-12 NOTE — ED Triage Notes (Addendum)
Pt presents with c/o recent COVID POS (09/02/19) and reports continued SHOB, slight cough and congestion. Pt states he was given Flovent inhaler, he has been taking as directed. Pt does not have an albuterol inhaler. He did use his wife's albuterol last night with no improvement. Pt states the [facility] is fairly constant. Pt is able to have a full conversation without stopping for air. Pt reports hx tachycardia and is not taking Metoprolol on regular basis.

## 2019-09-14 ENCOUNTER — Telehealth: Payer: Self-pay | Admitting: Family Medicine

## 2019-09-14 DIAGNOSIS — R918 Other nonspecific abnormal finding of lung field: Secondary | ICD-10-CM

## 2019-09-14 DIAGNOSIS — J8410 Pulmonary fibrosis, unspecified: Secondary | ICD-10-CM | POA: Insufficient documentation

## 2019-09-14 DIAGNOSIS — R911 Solitary pulmonary nodule: Secondary | ICD-10-CM

## 2019-09-14 NOTE — Telephone Encounter (Signed)
Spoke with pt relaying Dr. Synthia Innocent message.  Pt verbalizes understanding.  Says he is still having SOB but better than previous days.  Says albuterol inhaler (from UC) and Flovent, helpful.  Pt wants to see what CT will say before scheduling OV.  Also, pt states he has returned to work.  FYI to Dr. Darnell Level.

## 2019-09-14 NOTE — Telephone Encounter (Signed)
Pt seen over weekend at [facility] for dyspnea and chest pain  Xray showed lung nodule. rec CT - I have ordered CT scan.  plz call for update on symptoms. If ongoing trouble, would offer in-office appointment for further evaluation. Alternatively may wait for CT scan if he prefers.

## 2019-09-24 ENCOUNTER — Encounter: Payer: Self-pay | Admitting: Family Medicine

## 2019-09-24 ENCOUNTER — Ambulatory Visit
Admission: RE | Admit: 2019-09-24 | Discharge: 2019-09-24 | Disposition: A | Discharge status: Home / Self Care | Payer: Managed Care, Other (non HMO) | Source: Ambulatory Visit | Attending: Family Medicine | Admitting: Family Medicine

## 2019-09-24 ENCOUNTER — Other Ambulatory Visit: Payer: Self-pay

## 2019-09-24 DIAGNOSIS — R918 Other nonspecific abnormal finding of lung field: Secondary | ICD-10-CM | POA: Diagnosis not present

## 2019-09-25 ENCOUNTER — Encounter: Payer: Self-pay | Admitting: Family Medicine

## 2019-11-25 ENCOUNTER — Telehealth: Payer: Self-pay | Admitting: Internal Medicine

## 2019-11-25 NOTE — Telephone Encounter (Signed)
Patient calling in regards to overdue recall Patient has been doing fine - does not feel the need to continue care at this time Recall deleted

## 2020-05-25 ENCOUNTER — Encounter: Payer: Self-pay | Admitting: Family Medicine

## 2020-05-25 ENCOUNTER — Ambulatory Visit: Payer: Managed Care, Other (non HMO) | Admitting: Family Medicine

## 2020-05-25 ENCOUNTER — Other Ambulatory Visit: Payer: Self-pay

## 2020-05-25 VITALS — BP 124/82 | HR 91 | Temp 98.3°F | Ht 70.0 in | Wt 217.5 lb

## 2020-05-25 DIAGNOSIS — M6281 Muscle weakness (generalized): Secondary | ICD-10-CM | POA: Diagnosis not present

## 2020-05-25 DIAGNOSIS — M222X2 Patellofemoral disorders, left knee: Secondary | ICD-10-CM | POA: Diagnosis not present

## 2020-05-25 NOTE — Patient Instructions (Signed)
[facility]  App on phone

## 2020-05-25 NOTE — Progress Notes (Signed)
Benjamin Bonillas T. Daoud Lobue, MD, [name]  Primary Care and [place] at [facility] [place], [address]  Phone: [phone]  FAX: [place] - 44 y.o. male  MRN [id number]  Date of Birth: 1975/06/29  Date: 05/25/2020  PCP: Ria Bush, MD  Referral: Ria Bush, MD  Chief Complaint  Patient presents with  . Knee Pain    Left    This visit occurred during the SARS-CoV-2 public health emergency.  Safety protocols were in place, including screening questions prior to the visit, additional usage of staff PPE, and extensive cleaning of exam room while observing appropriate contact time as indicated for disinfecting solutions.   Subjective:   Benjamin Norman is a 44 y.o. very pleasant male patient with Body mass index is 31.21 kg/m. who presents with the following:  Benjamin Norman in [place].  Plaed his foot and twisted.    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  Initially he had quite a bit of difficulty even standing and moving.  He tells me that he had initially went to [facility], and they felt that he possibly had a strain.  Question ligamentous sprain.  He tells me that the plain films that they did were entirely normal.  He was initially on crutches and what sounds like a patellar J brace.  He is now having predominantly anterior knee pain, and is having some difficulty running.  After he runs for at least 5 minutes it seems to improve, then he is able to complete his run.  Went to [employer].  Trouble initially moving.   XR neg per report   When it got cold, seemed like it was   Does seem like it is and sometimes will be gritty.    Review of Systems is noted in the HPI, as appropriate   Objective:   BP 124/82   Pulse 91   Temp 98.3 F (36.8 C) (Temporal)   Ht 5\' 10"   (1.778 m)   Wt 217 lb 8 oz (98.7 kg)   SpO2 97%   BMI 31.21 kg/m   Left knee: There is obvious quadriceps wasting on the left compared to the right. No significant pain with loading the medial lateral patellar facets. No significant pain on the medial lateral joint line. He does have some patellar crepitus and mild discomfort with patellar grind.  ACL, PCL, MCL, and LCL are all intact. No significant pain with deep flexion, McMurray's, bounce home testing, Apley's grind maneuver.  Radiology: No results found.  Assessment and Plan:     ICD-10-CM   1. Patellofemoral pain syndrome of left knee  M22.2X2   2. Quadriceps weakness  M62.81    Very reassuring exam.  Consistent with patellofemoral pain syndrome.  Think that a lot of this is worsened due to his quadriceps atrophy.  This is certainly not helping matters, and I gave him a rehab protocol from [place] [facility].  Medications Discontinued During This Encounter  Medication Reason  . albuterol (VENTOLIN HFA) 108 (90 Base) MCG/ACT inhaler Completed Course  . vitamin C (ASCORBIC ACID) 250 MG tablet Completed Course  . aspirin EC 81 MG tablet Completed Course  . benzonatate (TESSALON) 100 MG capsule Completed Course  . Cholecalciferol (VITAMIN D3 GUMMIES PO) Completed Course  . QUERCETIN PO Completed Course  .  metoprolol tartrate (LOPRESSOR) 25 MG tablet Completed Course  . zinc gluconate 50 MG tablet Completed Course  . fluticasone (FLOVENT HFA) 44 MCG/ACT inhaler Completed Course   No orders of the defined types were placed in this encounter.   Follow-up: No follow-ups on file.  Signed,  [name]. Jamaree Hosier, MD   Outpatient Encounter Medications as of 05/25/2020  Medication Sig  . cetirizine (ZYRTEC) 5 MG tablet Take 5 mg by mouth daily.  . Multiple Vitamin (MULTIVITAMINS PO) Take by mouth daily.  . [DISCONTINUED] albuterol (VENTOLIN HFA) 108 (90 Base) MCG/ACT inhaler Inhale 1-2 puffs into the lungs every 6  (six) hours as needed for wheezing or shortness of breath. Use with spacer  . [DISCONTINUED] aspirin EC 81 MG tablet Take 81 mg by mouth daily.  . [DISCONTINUED] benzonatate (TESSALON) 100 MG capsule Take 1-2 capsules (100-200 mg total) by mouth 3 (three) times daily as needed for cough. (Patient not taking: Reported on 09/12/2019)  . [DISCONTINUED] Budesonide 90 MCG/ACT inhaler Inhale 1 puff into the lungs 2 (two) times daily for 7 days.  . [DISCONTINUED] Cholecalciferol (VITAMIN D3 GUMMIES PO) Take by mouth daily.  . [DISCONTINUED] fluticasone (FLOVENT HFA) 44 MCG/ACT inhaler Inhale 2 puffs into the lungs in the morning and at bedtime for 7 days.  . [DISCONTINUED] metoprolol tartrate (LOPRESSOR) 25 MG tablet Take 1 tablet (25 mg total) by mouth 2 (two) times daily as needed. (Patient not taking: Reported on 09/12/2019)  . [DISCONTINUED] QUERCETIN PO Take by mouth.  . [DISCONTINUED] vitamin C (ASCORBIC ACID) 250 MG tablet Take 250 mg by mouth daily.  . [DISCONTINUED] zinc gluconate 50 MG tablet Take 50 mg by mouth daily.   No facility-administered encounter medications on file as of 05/25/2020.

## 2020-06-12 NOTE — Progress Notes (Signed)
Mung Rinker T. Kasai Beltran, MD, [place]  Primary Care and [place] at [facility] [place], [address]  Phone: [phone]   FAX: [place] - 45 y.o. male   MRN [id number]   Date of Birth: [birth year]  Date: 06/13/2020   PCP: Ria Bush, MD   Referral: Ria Bush, MD  Chief Complaint  Patient presents with   Neck Pain    Moved to Upper Arm-Took wife's flexeril and meloxicam    This visit occurred during the SARS-CoV-2 public health emergency.  Safety protocols were in place, including screening questions prior to the visit, additional usage of staff PPE, and extensive cleaning of exam room while observing appropriate contact time as indicated for disinfecting solutions.   Subjective:   Benjamin Norman is a 45 y.o. very pleasant male patient with Body mass index is 31.82 kg/m. who presents with the following:  He presents today to discuss some acute shoulder as well as neck pain.  About 2 weeks ago.  Worried about some things at work. Seemed like it was getting better. Was taking some NSAIDS and a muscle relaxer.   18+ hours of work on one day.  He was working 80+ hours a week then.  Wed. Took some flexeril at night.  Seemed like getting a little bit better.  Was using a TENS.  Seemed like it went to the upper trap and in the shoulder.  Felt like his muscle was burning.   Took some mobic. Took three muscle relaxers in one day.   Started to work out a little bit, but he has only worked out about twice in a month.  Was working all the time.  Now will be working 8 hours a day. 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  He does not describe radiculopathy.  Review of Systems is noted in the HPI, as appropriate   Objective:   BP 120/90    Pulse (!) 108    Temp 98.2 F (36.8 C) (Temporal)    Ht 5\' 10"  (1.778 m)    Wt 221 lb 12 oz (100.6 kg)    SpO2 98%     BMI 31.82 kg/m    GEN: alert,appropriate PSYCH: Normally interactive. Cooperative during the interview.   CERVICAL SPINE EXAM Range of motion: Flexion, extension, lateral bending, and rotation: Approximate 30% loss of motion in all directions Pain with terminal motion: Yes Spinous Processes: NT SCM: NT Upper paracervical muscles: Yes Upper traps: Mildly tender  Isolates more to the scalene muscles, right greater than left C5-T1 intact, sensation and motor    Shoulder: B Inspection: No muscle wasting or winging Ecchymosis/edema: neg  AC joint, scapula, clavicle: NT Abduction: full, 5/5 Flexion: full, 5/5 IR, full, lift-off: 5/5 ER at neutral: full, 5/5  Radiology: No results found.  Assessment and Plan:     ICD-10-CM   1. Cervicalgia  M54.2   2. Trapezius muscle strain, right, initial encounter  S46.811A    Neck pain and trap as well as scalene injury. I reviewed range of motion regarding the scalene and upper trap as well as scapular stabilizers.  Burst of prednisone, all prior NSAIDs and Tylenol helped minimally. Also given some Flexeril 10 mg at nighttime.  Meds ordered this encounter  Medications   cyclobenzaprine (FLEXERIL) 10 MG tablet    Sig: Take 1 tablet (10 mg total) by mouth at bedtime  as needed for muscle spasms.    Dispense:  30 tablet    Refill:  1   predniSONE (DELTASONE) 20 MG tablet    Sig: 2 tabs po daily for 5 days, then 1 tab po daily for 5 days    Dispense:  15 tablet    Refill:  0   There are no discontinued medications. No orders of the defined types were placed in this encounter.   Follow-up: No follow-ups on file.  Signed,  [name]. Kailand Seda, MD   Outpatient Encounter Medications as of 06/13/2020  Medication Sig   cetirizine (ZYRTEC) 5 MG tablet Take 5 mg by mouth daily.   cyclobenzaprine (FLEXERIL) 10 MG tablet Take 1 tablet (10 mg total) by mouth at bedtime as needed for muscle spasms.   Multiple Vitamin  (MULTIVITAMINS PO) Take by mouth daily.   predniSONE (DELTASONE) 20 MG tablet 2 tabs po daily for 5 days, then 1 tab po daily for 5 days   [DISCONTINUED] Budesonide 90 MCG/ACT inhaler Inhale 1 puff into the lungs 2 (two) times daily for 7 days.   No facility-administered encounter medications on file as of 06/13/2020.

## 2020-06-13 ENCOUNTER — Encounter: Payer: Self-pay | Admitting: Family Medicine

## 2020-06-13 ENCOUNTER — Other Ambulatory Visit: Payer: Self-pay

## 2020-06-13 ENCOUNTER — Ambulatory Visit: Payer: Managed Care, Other (non HMO) | Admitting: Family Medicine

## 2020-06-13 VITALS — BP 120/90 | HR 108 | Temp 98.2°F | Ht 70.0 in | Wt 221.8 lb

## 2020-06-13 DIAGNOSIS — M542 Cervicalgia: Secondary | ICD-10-CM

## 2020-06-13 DIAGNOSIS — S46811A Strain of other muscles, fascia and tendons at shoulder and upper arm level, right arm, initial encounter: Secondary | ICD-10-CM | POA: Diagnosis not present

## 2020-06-13 DIAGNOSIS — M25519 Pain in unspecified shoulder: Secondary | ICD-10-CM

## 2020-06-13 MED ORDER — CYCLOBENZAPRINE HCL 10 MG PO TABS: 10.0000 mg | ORAL_TABLET | Freq: Every evening | ORAL | 1 refills | Status: DC | PRN

## 2020-06-13 MED ORDER — PREDNISONE 20 MG PO TABS: ORAL_TABLET | ORAL | 0 refills | Status: DC

## 2020-09-17 ENCOUNTER — Other Ambulatory Visit: Payer: Self-pay | Admitting: Family Medicine

## 2020-09-17 DIAGNOSIS — Z1159 Encounter for screening for other viral diseases: Secondary | ICD-10-CM

## 2020-09-17 DIAGNOSIS — I471 Supraventricular tachycardia: Secondary | ICD-10-CM

## 2020-09-17 IMAGING — CR DG CHEST 2V
2 series · 2 of 2 positions shown · non-contrast
Comparison: None.

CLINICAL DATA: Chest pain.  Shortness of breath.

EXAM:
CHEST - 2 VIEW

[chest pa]
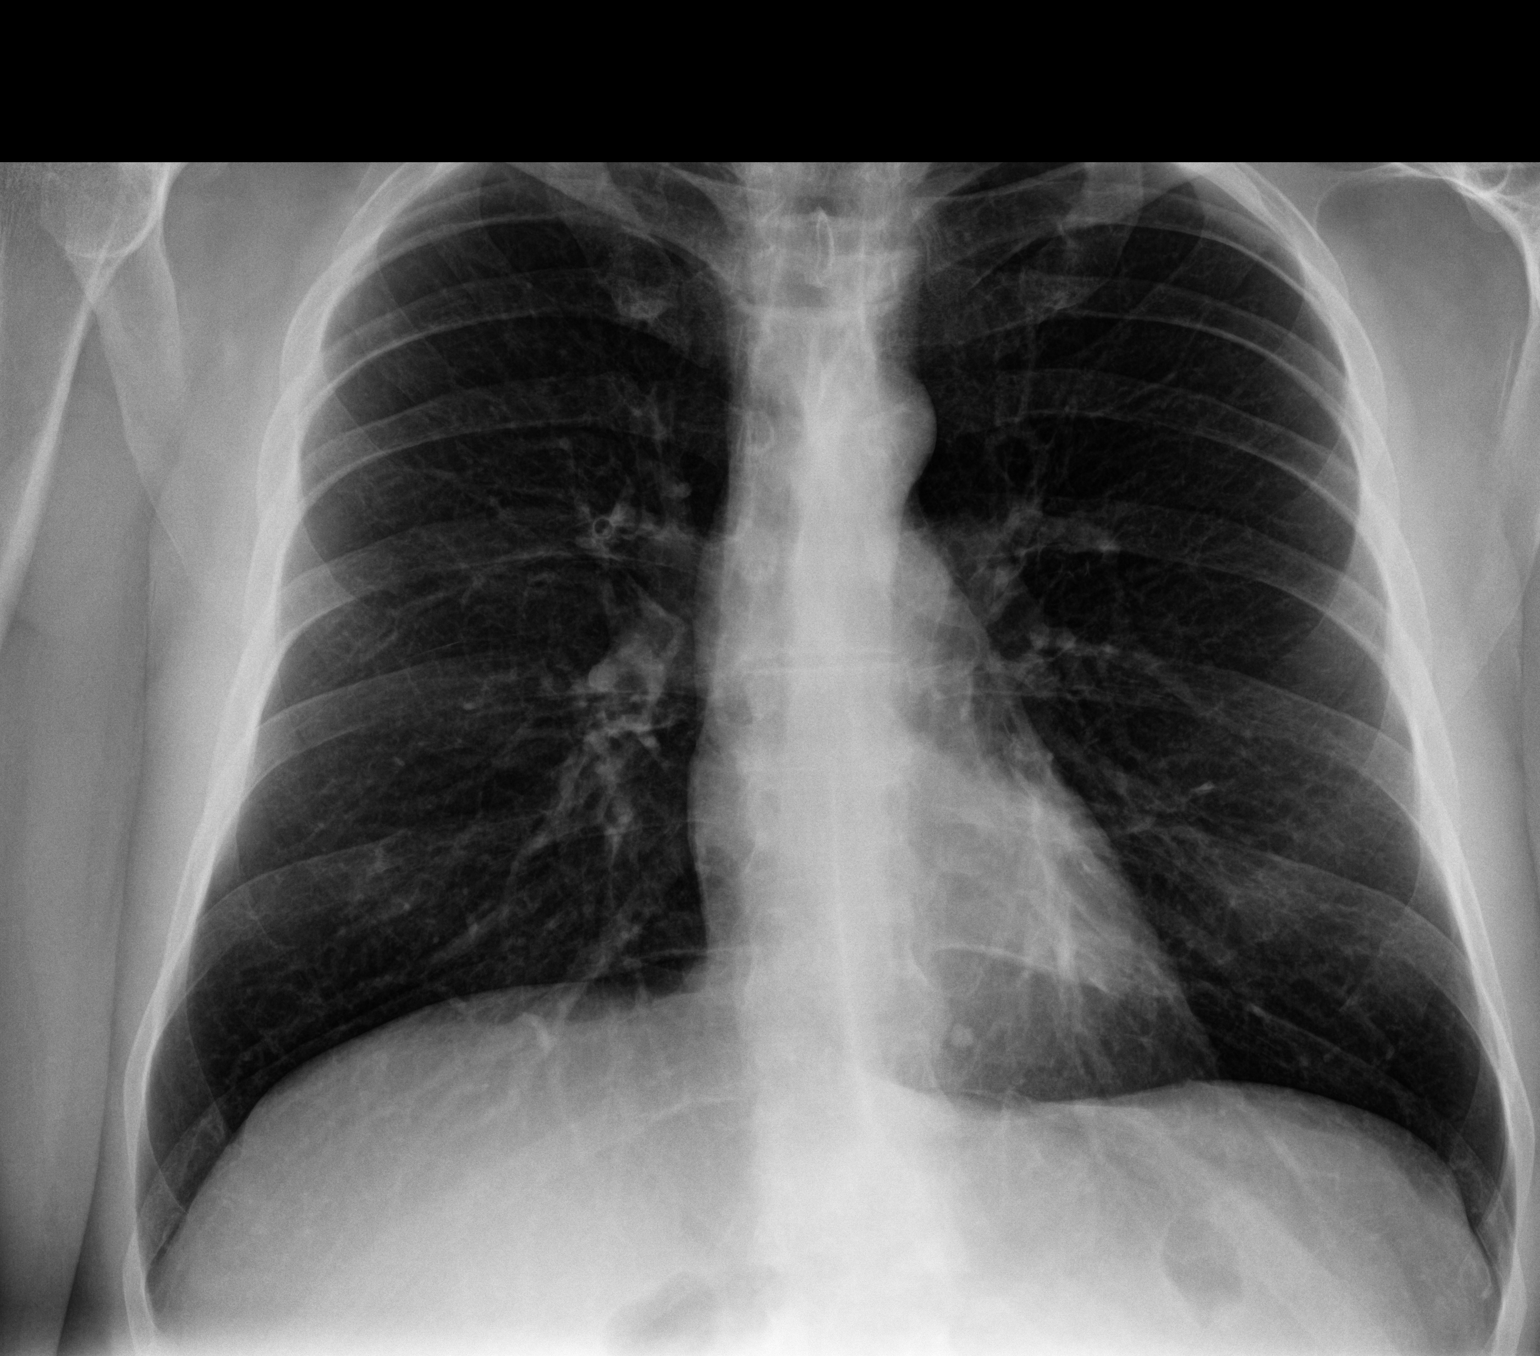

[chest lat]
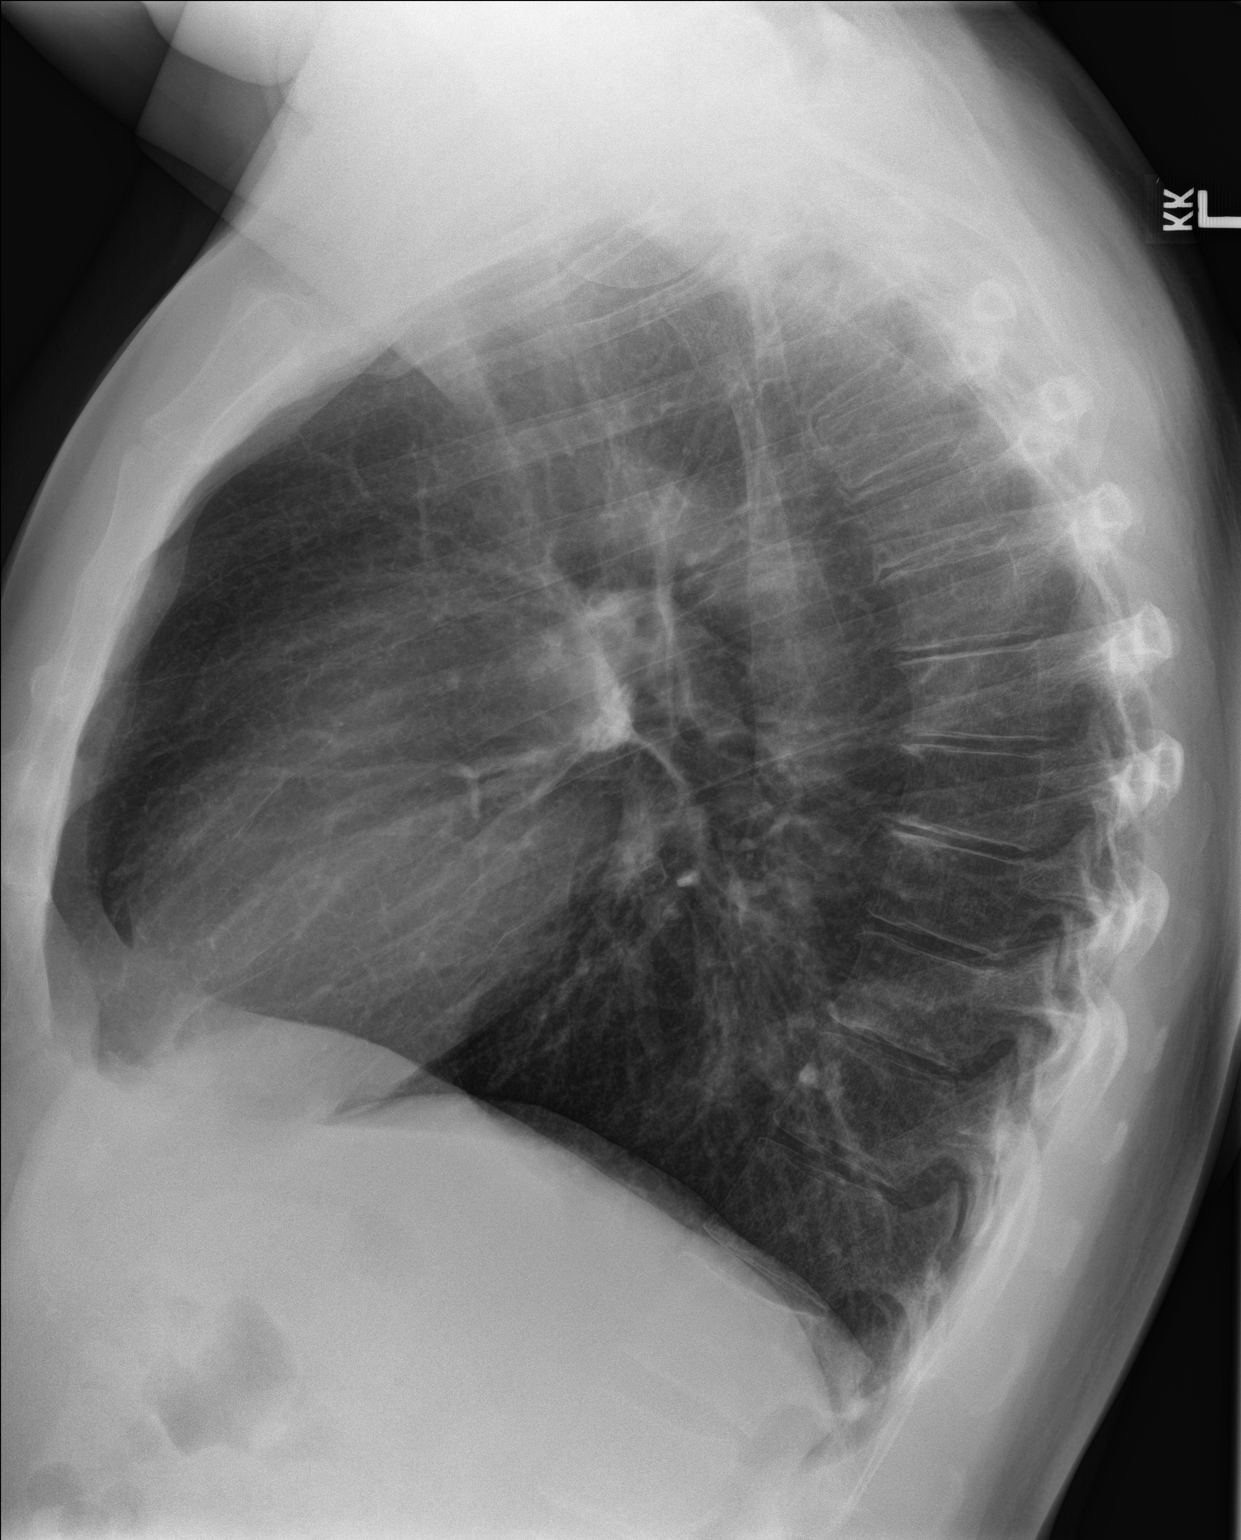

[2 of 2 positions shown; findings below may reference images not displayed]

FINDINGS: There is a dense nodule in the medial posterior left lung. The
heart, hila, mediastinum are normal. No pneumothorax. No other
nodules. No masses or infiltrates.
IMPRESSION: 1. No acute abnormalities are identified.
2. There is a dense 6 mm nodule in the medial posterior left lower
lobe overlapping the spine on the lateral view and the heart on the
frontal view. I suspect this nodule is calcified but am not 100%
certain. Recommend comparison to outside films if available. If none
are available, recommend a CT scan for complete characterization.

## 2020-09-23 ENCOUNTER — Other Ambulatory Visit: Payer: Self-pay

## 2020-09-23 ENCOUNTER — Other Ambulatory Visit (INDEPENDENT_AMBULATORY_CARE_PROVIDER_SITE_OTHER): Payer: Managed Care, Other (non HMO)

## 2020-09-23 DIAGNOSIS — Z1159 Encounter for screening for other viral diseases: Secondary | ICD-10-CM | POA: Diagnosis not present

## 2020-09-23 DIAGNOSIS — I471 Supraventricular tachycardia: Secondary | ICD-10-CM | POA: Diagnosis not present

## 2020-09-23 LAB — COMPREHENSIVE METABOLIC PANEL
ALT: 29 U/L (ref 0–53)
AST: 18 U/L (ref 0–37)
Albumin: 4.3 g/dL (ref 3.5–5.2)
Alkaline Phosphatase: 59 U/L (ref 39–117)
BUN: 12 mg/dL (ref 6–23)
CO2: 29 mEq/L (ref 19–32)
Calcium: 9.6 mg/dL (ref 8.4–10.5)
Chloride: 105 mEq/L (ref 96–112)
Creatinine, Ser: 0.88 mg/dL (ref 0.40–1.50)
GFR: 104.29 mL/min (ref >60.00)
Glucose, Bld: 94 mg/dL (ref 70–99)
Potassium: 4.3 mEq/L (ref 3.5–5.1)
Sodium: 141 mEq/L (ref 135–145)
Total Bilirubin: 1.1 mg/dL (ref 0.2–1.2)
Total Protein: 6.6 g/dL (ref 6.0–8.3)

## 2020-09-23 LAB — TSH: TSH: 1.13 u[IU]/mL (ref 0.35–4.50)

## 2020-09-26 LAB — HEPATITIS C ANTIBODY
Hepatitis C Ab: NONREACTIVE
SIGNAL TO CUT-OFF: 0.01 (ref <1.00)

## 2020-09-30 ENCOUNTER — Ambulatory Visit (INDEPENDENT_AMBULATORY_CARE_PROVIDER_SITE_OTHER): Payer: Managed Care, Other (non HMO) | Admitting: Family Medicine

## 2020-09-30 ENCOUNTER — Encounter: Payer: Self-pay | Admitting: Family Medicine

## 2020-09-30 ENCOUNTER — Other Ambulatory Visit: Payer: Self-pay

## 2020-09-30 VITALS — BP 134/88 | HR 66 | Temp 98.0°F | Ht 70.0 in | Wt 219.6 lb

## 2020-09-30 DIAGNOSIS — Z1211 Encounter for screening for malignant neoplasm of colon: Secondary | ICD-10-CM

## 2020-09-30 DIAGNOSIS — I471 Supraventricular tachycardia: Secondary | ICD-10-CM | POA: Diagnosis not present

## 2020-09-30 DIAGNOSIS — Z0000 Encounter for general adult medical examination without abnormal findings: Secondary | ICD-10-CM

## 2020-09-30 DIAGNOSIS — E669 Obesity, unspecified: Secondary | ICD-10-CM

## 2020-09-30 MED ORDER — FEXOFENADINE HCL 180 MG PO TABS: 180.0000 mg | ORAL_TABLET | Freq: Every day | ORAL | Status: DC

## 2020-09-30 NOTE — Progress Notes (Signed)
Patient ID: Benjamin Norman, male    DOB: 1975-09-28, 45 y.o.   MRN: [id number]  This visit was conducted in person.  BP 134/88   Pulse 66   Temp 98 F (36.7 C) (Temporal)   Ht 5' 10"  (1.778 m)   Wt 219 lb 9 oz (99.6 kg)   SpO2 97%   BMI 31.50 kg/m    CC: CPE  Subjective:   HPI: Benjamin Norman is a 45 y.o. male presenting on 09/30/2020 for Annual Exam   H/o AV re entry SVT previously prescribed metoprolol PRN but doesn't use.  Known congenital CN 4 palsy (right).  Did have COVID illness - did fully recover.   Preventative: Colon cancer screening - discussed starting age 75yo. Would be interested in iFOB.  Flu shot yearly at work. COVID vaccine - discussed, declines  Td 2009. Tdap - 08/2019  Seat belt use discussed  Sunscreen use discussed. Sees dermatologistyearly for h/o atypical nevi.  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 Interim medical history since our last visit reviewed. Allergies and medications reviewed and updated. Outpatient Medications Prior to Visit  Medication Sig Dispense Refill  . Multiple Vitamin (MULTIVITAMINS PO) Take by mouth daily.    . cetirizine (ZYRTEC) 5 MG tablet Take 5 mg by mouth daily.    . cyclobenzaprine (FLEXERIL) 10 MG tablet Take 1 tablet (10 mg total) by mouth at bedtime as needed for muscle spasms. 30 tablet 1  . predniSONE (DELTASONE) 20 MG tablet 2 tabs po daily for 5 days, then 1 tab po daily for 5 days 15 tablet 0   No facility-administered medications prior to visit.     Per HPI unless specifically indicated in ROS section below Review of Systems  Constitutional: Negative for activity change,  appetite change, chills, fatigue, fever and unexpected weight change.  HENT: Negative for hearing loss.   Eyes: Negative for visual disturbance.  Respiratory: Negative for cough, chest tightness, shortness of breath and wheezing.   Cardiovascular: Negative for chest pain, palpitations and leg swelling.  Gastrointestinal: Negative for abdominal distention, abdominal pain, blood in stool, constipation, diarrhea, nausea and vomiting.  Genitourinary: Negative for difficulty urinating and hematuria.  Musculoskeletal: Negative for arthralgias, myalgias and neck pain.  Skin: Negative for rash.  Neurological: Negative for dizziness, seizures, syncope and headaches.  Hematological: Negative for adenopathy. Does not bruise/bleed easily.  Psychiatric/Behavioral: Negative for dysphoric mood. The patient is not nervous/anxious.    Objective:  BP 134/88   Pulse 66   Temp 98 F (36.7 C) (Temporal)   Ht 5' 10"  (1.778 m)   Wt 219 lb 9 oz (99.6 kg)   SpO2 97%   BMI 31.50 kg/m   Wt Readings from Last 3 Encounters:  09/30/20 219 lb 9 oz (99.6 kg)  06/13/20 221 lb 12 oz (100.6 kg)  05/25/20 217 lb 8 oz (98.7 kg)      Physical Exam Vitals and nursing note reviewed.  Constitutional:      General: He is not in acute distress.    Appearance: Normal appearance. He is well-developed. He is not ill-appearing.  HENT:     Head: Normocephalic  and atraumatic.     Right Ear: Hearing, tympanic membrane, ear canal and external ear normal.     Left Ear: Hearing, tympanic membrane, ear canal and external ear normal.  Eyes:     General: No scleral icterus.    Extraocular Movements: Extraocular movements intact.     Conjunctiva/sclera: Conjunctivae normal.     Pupils: Pupils are equal, round, and reactive to light.  Neck:     Thyroid: No thyroid mass or thyromegaly.  Cardiovascular:     Rate and Rhythm: Normal rate and regular rhythm.     Pulses: Normal pulses.          Radial pulses are 2+ on the right  side and 2+ on the left side.     Heart sounds: Normal heart sounds. No murmur heard.   Pulmonary:     Effort: Pulmonary effort is normal. No respiratory distress.     Breath sounds: Normal breath sounds. No wheezing, rhonchi or rales.  Abdominal:     General: Bowel sounds are normal. There is no distension.     Palpations: Abdomen is soft. There is no mass.     Tenderness: There is no abdominal tenderness. There is no guarding or rebound.     Hernia: No hernia is present.  Musculoskeletal:        General: Normal range of motion.     Cervical back: Normal range of motion and neck supple.     Right lower leg: No edema.     Left lower leg: No edema.  Lymphadenopathy:     Cervical: No cervical adenopathy.  Skin:    General: Skin is warm and dry.     Findings: No rash.  Neurological:     General: No focal deficit present.     Mental Status: He is alert and oriented to person, place, and time.     Comments: CN grossly intact, station and gait intact  Psychiatric:        Mood and Affect: Mood normal.        Behavior: Behavior normal.        Thought Content: Thought content normal.        Judgment: Judgment normal.       Results for orders placed or performed in visit on 09/23/20  Hepatitis C antibody  Result Value Ref Range   Hepatitis C Ab NON-REACTIVE NON-REACTIVE   SIGNAL TO CUT-OFF 0.01 <1.00  Comprehensive metabolic panel  Result Value Ref Range   Sodium 141 135 - 145 mEq/L   Potassium 4.3 3.5 - 5.1 mEq/L   Chloride 105 96 - 112 mEq/L   CO2 29 19 - 32 mEq/L   Glucose, Bld 94 70 - 99 mg/dL   BUN 12 6 - 23 mg/dL   Creatinine, Ser 0.88 0.40 - 1.50 mg/dL   Total Bilirubin 1.1 0.2 - 1.2 mg/dL   Alkaline Phosphatase 59 39 - 117 U/L   AST 18 0 - 37 U/L   ALT 29 0 - 53 U/L   Total Protein 6.6 6.0 - 8.3 g/dL   Albumin 4.3 3.5 - 5.2 g/dL   GFR 104.29 >60.00 mL/min   Calcium 9.6 8.4 - 10.5 mg/dL  TSH  Result Value Ref Range   TSH 1.13 0.35 - 4.50 uIU/mL   Assessment &  Plan:  This visit occurred during the SARS-CoV-2 public health emergency.  Safety protocols were in place, including screening questions prior to the visit, additional usage of staff PPE, and extensive  cleaning of exam room while observing appropriate contact time as indicated for disinfecting solutions.   Problem List Items Addressed This Visit    Healthcare maintenance - Primary    Preventative protocols reviewed and updated unless pt declined. Discussed healthy diet and lifestyle.       SVT (supraventricular tachycardia) (HCC)    Infrequent episodes managed without meds.       Obesity, Class I, BMI 30-34.9    Encouraged healthy diet and lifestyle choices to affect sustainable weight loss.        Other Visit Diagnoses    Special screening for malignant neoplasms, colon       Relevant Orders   Fecal occult blood, imunochemical       Meds ordered this encounter  Medications  . fexofenadine (ALLEGRA ALLERGY) 180 MG tablet    Sig: Take 1 tablet (180 mg total) by mouth daily.   Orders Placed This Encounter  Procedures  . Fecal occult blood, imunochemical    Standing Status:   Future    Standing Expiration Date:   09/30/2021    Patient instructions: Pass by lab to pick up stool kit.  You are doing well today Return as needed or in 1 year for next physical.   Follow up plan: Return in about 1 year (around 09/30/2021) for annual exam, prior fasting for blood work.  Ria Bush, MD

## 2020-09-30 NOTE — Assessment & Plan Note (Signed)
Encouraged healthy diet and lifestyle choices to affect sustainable weight loss.  ?

## 2020-09-30 NOTE — Patient Instructions (Addendum)
Pass by lab to pick up stool kit.  You are doing well today Return as needed or in 1 year for next physical.   Health Maintenance, Male Adopting a healthy lifestyle and getting preventive care are important in promoting health and wellness. Ask your health care provider about:  The right schedule for you to have regular tests and exams.  Things you can do on your own to prevent diseases and keep yourself healthy. What should I know about diet, weight, and exercise? Eat a healthy diet  Eat a diet that includes plenty of vegetables, fruits, low-fat dairy products, and lean protein.  Do not eat a lot of foods that are high in solid fats, added sugars, or sodium.   Maintain a healthy weight Body mass index (BMI) is a measurement that can be used to identify possible weight problems. It estimates body fat based on height and weight. Your health care provider can help determine your BMI and help you achieve or maintain a healthy weight. Get regular exercise Get regular exercise. This is one of the most important things you can do for your health. Most adults should:  Exercise for at least 150 minutes each week. The exercise should increase your heart rate and make you sweat (moderate-intensity exercise).  Do strengthening exercises at least twice a week. This is in addition to the moderate-intensity exercise.  Spend less time sitting. Even light physical activity can be beneficial. Watch cholesterol and blood lipids Have your blood tested for lipids and cholesterol at 45 years of age, then have this test every 5 years. You may need to have your cholesterol levels checked more often if:  Your lipid or cholesterol levels are high.  You are older than 45 years of age.  You are at high risk for heart disease. What should I know about cancer screening? Many types of cancers can be detected early and may often be prevented. Depending on your health history and family history, you may need to  have cancer screening at various ages. This may include screening for:  Colorectal cancer.  Prostate cancer.  Skin cancer.  Lung cancer. What should I know about heart disease, diabetes, and high blood pressure? Blood pressure and heart disease  High blood pressure causes heart disease and increases the risk of stroke. This is more likely to develop in people who have high blood pressure readings, are of African descent, or are overweight.  Talk with your health care provider about your target blood pressure readings.  Have your blood pressure checked: ? Every 3-5 years if you are 65-76 years of age. ? Every year if you are 21 years old or older.  If you are between the ages of 66 and 35 and are a current or former smoker, ask your health care provider if you should have a one-time screening for abdominal aortic aneurysm (AAA). Diabetes Have regular diabetes screenings. This checks your fasting blood sugar level. Have the screening done:  Once every three years after age 24 if you are at a normal weight and have a low risk for diabetes.  More often and at a younger age if you are overweight or have a high risk for diabetes. What should I know about preventing infection? Hepatitis B If you have a higher risk for hepatitis B, you should be screened for this virus. Talk with your health care provider to find out if you are at risk for hepatitis B infection. Hepatitis C Blood testing is recommended for:  Everyone born from [birth year] through 1965.  Anyone with known risk factors for hepatitis C. Sexually transmitted infections (STIs)  You should be screened each year for STIs, including gonorrhea and chlamydia, if: ? You are sexually active and are younger than 45 years of age. ? You are older than 45 years of age and your health care provider tells you that you are at risk for this type of infection. ? Your sexual activity has changed since you were last screened, and you are at  increased risk for chlamydia or gonorrhea. Ask your health care provider if you are at risk.  Ask your health care provider about whether you are at high risk for HIV. Your health care provider may recommend a prescription medicine to help prevent HIV infection. If you choose to take medicine to prevent HIV, you should first get tested for HIV. You should then be tested every 3 months for as long as you are taking the medicine. Follow these instructions at home: Lifestyle  Do not use any products that contain nicotine or tobacco, such as cigarettes, e-cigarettes, and chewing tobacco. If you need help quitting, ask your health care provider.  Do not use street drugs.  Do not share needles.  Ask your health care provider for help if you need support or information about quitting drugs. Alcohol use  Do not drink alcohol if your health care provider tells you not to drink.  If you drink alcohol: ? Limit how much you have to 0-2 drinks a day. ? Be aware of how much alcohol is in your drink. In the [place] one drink equals one 12 oz bottle of beer (355 mL), one 5 oz glass of wine (148 mL), or one 1 oz glass of hard liquor (44 mL). General instructions  Schedule regular health, dental, and eye exams.  Stay current with your vaccines.  Tell your health care provider if: ? You often feel depressed. ? You have ever been abused or do not feel safe at home. Summary  Adopting a healthy lifestyle and getting preventive care are important in promoting health and wellness.  Follow your health care provider's instructions about healthy diet, exercising, and getting tested or screened for diseases.  Follow your health care provider's instructions on monitoring your cholesterol and blood pressure. This information is not intended to replace advice given to you by your health care provider. Make sure you discuss any questions you have with your health care provider. Document Revised: 05/14/2018  Document Reviewed: 05/14/2018 Elsevier Patient Education  2021 [employer].

## 2020-09-30 NOTE — Assessment & Plan Note (Signed)
Preventative protocols reviewed and updated unless pt declined. Discussed healthy diet and lifestyle.  

## 2020-09-30 NOTE — Assessment & Plan Note (Signed)
Infrequent episodes managed without meds.

## 2020-11-08 ENCOUNTER — Other Ambulatory Visit (INDEPENDENT_AMBULATORY_CARE_PROVIDER_SITE_OTHER): Payer: Managed Care, Other (non HMO)

## 2020-11-08 ENCOUNTER — Encounter: Payer: Self-pay | Admitting: Family Medicine

## 2020-11-08 DIAGNOSIS — Z1211 Encounter for screening for malignant neoplasm of colon: Secondary | ICD-10-CM | POA: Diagnosis not present

## 2020-11-08 LAB — FECAL OCCULT BLOOD, GUAIAC: Fecal Occult Blood: NEGATIVE

## 2020-11-08 LAB — FECAL OCCULT BLOOD, IMMUNOCHEMICAL: Fecal Occult Bld: NEGATIVE

## 2021-02-06 ENCOUNTER — Encounter: Payer: Self-pay | Admitting: Family Medicine

## 2021-02-07 MED ORDER — HYDROCORTISONE ACETATE 25 MG RE SUPP: 25.0000 mg | Freq: Two times a day (BID) | RECTAL | 0 refills | Status: DC | PRN

## 2021-02-07 NOTE — Addendum Note (Signed)
Addended by: Ria Bush on: 02/07/2021 06:07 PM   Modules accepted: Orders

## 2021-02-08 MED ORDER — HYDROCORTISONE ACETATE 25 MG RE SUPP: 25.0000 mg | Freq: Two times a day (BID) | RECTAL | 0 refills | Status: DC | PRN

## 2021-02-08 NOTE — Addendum Note (Signed)
Addended by: Ria Bush on: 02/08/2021 06:48 PM   Modules accepted: Orders

## 2021-08-30 ENCOUNTER — Other Ambulatory Visit: Payer: Managed Care, Other (non HMO)

## 2021-09-06 ENCOUNTER — Encounter: Payer: Managed Care, Other (non HMO) | Admitting: Family Medicine

## 2021-09-21 ENCOUNTER — Other Ambulatory Visit: Payer: Self-pay | Admitting: Family Medicine

## 2021-09-21 DIAGNOSIS — E669 Obesity, unspecified: Secondary | ICD-10-CM

## 2021-09-25 ENCOUNTER — Other Ambulatory Visit: Payer: Managed Care, Other (non HMO)

## 2021-10-02 ENCOUNTER — Encounter: Payer: Self-pay | Admitting: Family Medicine

## 2021-10-02 ENCOUNTER — Ambulatory Visit (INDEPENDENT_AMBULATORY_CARE_PROVIDER_SITE_OTHER): Payer: Managed Care, Other (non HMO) | Admitting: Family Medicine

## 2021-10-02 VITALS — BP 122/80 | HR 64 | Temp 98.0°F | Ht 70.0 in | Wt 217.0 lb

## 2021-10-02 DIAGNOSIS — E669 Obesity, unspecified: Secondary | ICD-10-CM | POA: Diagnosis not present

## 2021-10-02 DIAGNOSIS — Z1211 Encounter for screening for malignant neoplasm of colon: Secondary | ICD-10-CM | POA: Diagnosis not present

## 2021-10-02 DIAGNOSIS — Z125 Encounter for screening for malignant neoplasm of prostate: Secondary | ICD-10-CM | POA: Diagnosis not present

## 2021-10-02 DIAGNOSIS — Z0000 Encounter for general adult medical examination without abnormal findings: Secondary | ICD-10-CM | POA: Diagnosis not present

## 2021-10-02 DIAGNOSIS — Z131 Encounter for screening for diabetes mellitus: Secondary | ICD-10-CM | POA: Diagnosis not present

## 2021-10-02 DIAGNOSIS — Z13220 Encounter for screening for lipoid disorders: Secondary | ICD-10-CM

## 2021-10-02 LAB — PSA: PSA: 1.24 ng/mL (ref 0.10–4.00)

## 2021-10-02 LAB — LIPID PANEL
Cholesterol: 192 mg/dL (ref 0–200)
HDL: 59.2 mg/dL (ref >39.00)
LDL Cholesterol: 111 mg/dL — ABNORMAL HIGH (ref 0–99)
NonHDL: 132.75
Total CHOL/HDL Ratio: 3
Triglycerides: 111 mg/dL (ref 0.0–149.0)
VLDL: 22.2 mg/dL (ref 0.0–40.0)

## 2021-10-02 LAB — BASIC METABOLIC PANEL
BUN: 10 mg/dL (ref 6–23)
CO2: 27 mEq/L (ref 19–32)
Calcium: 9.3 mg/dL (ref 8.4–10.5)
Chloride: 106 mEq/L (ref 96–112)
Creatinine, Ser: 0.85 mg/dL (ref 0.40–1.50)
GFR: 104.63 mL/min (ref >60.00)
Glucose, Bld: 83 mg/dL (ref 70–99)
Potassium: 4.3 mEq/L (ref 3.5–5.1)
Sodium: 142 mEq/L (ref 135–145)

## 2021-10-02 NOTE — Patient Instructions (Addendum)
Pass by lab for stool kit and lab work  ?Good to see you today ?Return as needed or in 1 year for next physical. ? ?Health Maintenance, Male ?Adopting a healthy lifestyle and getting preventive care are important in promoting health and wellness. Ask your health care provider about: ?The right schedule for you to have regular tests and exams. ?Things you can do on your own to prevent diseases and keep yourself healthy. ?What should I know about diet, weight, and exercise? ?Eat a healthy diet ? ?Eat a diet that includes plenty of vegetables, fruits, low-fat dairy products, and lean protein. ?Do not eat a lot of foods that are high in solid fats, added sugars, or sodium. ?Maintain a healthy weight ?Body mass index (BMI) is a measurement that can be used to identify possible weight problems. It estimates body fat based on height and weight. Your health care provider can help determine your BMI and help you achieve or maintain a healthy weight. ?Get regular exercise ?Get regular exercise. This is one of the most important things you can do for your health. Most adults should: ?Exercise for at least 150 minutes each week. The exercise should increase your heart rate and make you sweat (moderate-intensity exercise). ?Do strengthening exercises at least twice a week. This is in addition to the moderate-intensity exercise. ?Spend less time sitting. Even light physical activity can be beneficial. ?Watch cholesterol and blood lipids ?Have your blood tested for lipids and cholesterol at 46 years of age, then have this test every 5 years. ?You may need to have your cholesterol levels checked more often if: ?Your lipid or cholesterol levels are high. ?You are older than 46 years of age. ?You are at high risk for heart disease. ?What should I know about cancer screening? ?Many types of cancers can be detected early and may often be prevented. Depending on your health history and family history, you may need to have cancer  screening at various ages. This may include screening for: ?Colorectal cancer. ?Prostate cancer. ?Skin cancer. ?Lung cancer. ?What should I know about heart disease, diabetes, and high blood pressure? ?Blood pressure and heart disease ?High blood pressure causes heart disease and increases the risk of stroke. This is more likely to develop in people who have high blood pressure readings or are overweight. ?Talk with your health care provider about your target blood pressure readings. ?Have your blood pressure checked: ?Every 3-5 years if you are 42-34 years of age. ?Every year if you are 105 years old or older. ?If you are between the ages of 78 and 36 and are a current or former smoker, ask your health care provider if you should have a one-time screening for abdominal aortic aneurysm (AAA). ?Diabetes ?Have regular diabetes screenings. This checks your fasting blood sugar level. Have the screening done: ?Once every three years after age 63 if you are at a normal weight and have a low risk for diabetes. ?More often and at a younger age if you are overweight or have a high risk for diabetes. ?What should I know about preventing infection? ?Hepatitis B ?If you have a higher risk for hepatitis B, you should be screened for this virus. Talk with your health care provider to find out if you are at risk for hepatitis B infection. ?Hepatitis C ?Blood testing is recommended for: ?Everyone born from [birth year] through 1965. ?Anyone with known risk factors for hepatitis C. ?Sexually transmitted infections (STIs) ?You should be screened each year for STIs, including  gonorrhea and chlamydia, if: ?You are sexually active and are younger than 46 years of age. ?You are older than 46 years of age and your health care provider tells you that you are at risk for this type of infection. ?Your sexual activity has changed since you were last screened, and you are at increased risk for chlamydia or gonorrhea. Ask your health care provider if  you are at risk. ?Ask your health care provider about whether you are at high risk for HIV. Your health care provider may recommend a prescription medicine to help prevent HIV infection. If you choose to take medicine to prevent HIV, you should first get tested for HIV. You should then be tested every 3 months for as long as you are taking the medicine. ?Follow these instructions at home: ?Alcohol use ?Do not drink alcohol if your health care provider tells you not to drink. ?If you drink alcohol: ?Limit how much you have to 0-2 drinks a day. ?Know how much alcohol is in your drink. In the [place] one drink equals one 12 oz bottle of beer (355 mL), one 5 oz glass of wine (148 mL), or one 1? oz glass of hard liquor (44 mL). ?Lifestyle ?Do not use any products that contain nicotine or tobacco. These products include cigarettes, chewing tobacco, and vaping devices, such as e-cigarettes. If you need help quitting, ask your health care provider. ?Do not use street drugs. ?Do not share needles. ?Ask your health care provider for help if you need support or information about quitting drugs. ?General instructions ?Schedule regular health, dental, and eye exams. ?Stay current with your vaccines. ?Tell your health care provider if: ?You often feel depressed. ?You have ever been abused or do not feel safe at home. ?Summary ?Adopting a healthy lifestyle and getting preventive care are important in promoting health and wellness. ?Follow your health care provider's instructions about healthy diet, exercising, and getting tested or screened for diseases. ?Follow your health care provider's instructions on monitoring your cholesterol and blood pressure. ?This information is not intended to replace advice given to you by your health care provider. Make sure you discuss any questions you have with your health care provider. ?Document Revised: 10/10/2020 Document Reviewed: 10/10/2020 ?Elsevier Patient Education ? [employer]. ? ?

## 2021-10-02 NOTE — Progress Notes (Signed)
? ? Patient ID: Benjamin Norman, male    DOB: 04-07-1976, 46 y.o.   MRN: [id number] ? ?This visit was conducted in person. ? ?BP 122/80   Pulse 64   Temp 98 ?F (36.7 ?C) (Temporal)   Ht _0  (1.778 m)   Wt 217 lb (98.4 kg)   SpO2 97%   BMI 31.14 kg/m?   ? ?CC: CPE ?Subjective:  ? ?HPI: ?Benjamin Norman is a 46 y.o. male presenting on 10/02/2021 for Annual Exam ? ? ?H/o AV re entry SVT previously prescribed metoprolol PRN but doesn't use.  ?Known congenital CN 4 palsy (right).  ? ?Followed [employer] diet - lost 30 lbs Fall 2022. Has regained weight lost.  ? ?Notes some daytime somnolence. No noted snoring or witnessed apnea. Endorses restorative sleep.  ? ?Preventative: ?Colon cancer screening - iFOB normal 11/2020.  ?Flu shot yearly at work. ?COVID vaccine - declined ?Td 2009. Tdap 08/2019  ?Seat belt use discussed  ?Sunscreen use discussed. Sees dermatologist yearly for h/o atypical nevi.  ?Sleep - averaging 7-8 hours/night ?Non smoker ?Alcohol - social, mixed drinks  ?Dentist q6 mo  ?Eye exam yearly  ? 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   ? ?Relevant past medical, surgical, family and social history reviewed and updated as indicated. Interim medical history since our last visit reviewed. ?Allergies and medications reviewed and updated. ?Outpatient Medications Prior to Visit  ?Medication Sig Dispense Refill  ? cetirizine (ZYRTEC) 10 MG tablet Take 10 mg by mouth daily at 12 noon.    ? Multiple Vitamin (MULTIVITAMINS PO) Take by mouth daily.    ? fexofenadine (ALLEGRA ALLERGY) 180 MG tablet Take 1 tablet (180 mg total) by mouth daily.    ? hydrocortisone (ANUSOL-HC) 25 MG suppository Place 1 suppository (25 mg total) rectally 2 (two) times daily as needed for hemorrhoids. 12 suppository 0  ? ?No facility-administered medications prior to visit.  ?   ? ?Per HPI unless specifically indicated in ROS section below ?Review of Systems  ?Constitutional:  Negative for activity change, appetite change, chills, fatigue, fever and unexpected weight change.  ?HENT:  Negative for hearing loss.   ?Eyes:  Negative for visual disturbance.  ?Respiratory:  Negative for cough, chest tightness, shortness of breath and wheezing.   ?Cardiovascular:  Negative for chest pain, palpitations and leg swelling.  ?Gastrointestinal:  Negative for abdominal distention, abdominal pain, blood in stool, constipation, diarrhea, nausea and vomiting.  ?     Occ reflux  ?Genitourinary:  Negative for difficulty urinating and hematuria.  ?Musculoskeletal:  Negative for arthralgias, myalgias and neck pain.  ?Skin:  Negative for rash.  ?Neurological:  Positive for headaches (occ). Negative for dizziness, seizures and syncope.  ?Hematological:  Negative for adenopathy. Does not bruise/bleed easily.  ?Psychiatric/Behavioral:  Negative for dysphoric mood. The patient is not nervous/anxious.   ? ?Objective:  ?BP 122/80   Pulse 64   Temp 98 ?F (36.7 ?C) (Temporal)   Ht _1  (1.778 m)   Wt 217 lb (98.4 kg)   SpO2 97%   BMI 31.14 kg/m?   ?Wt Readings from Last 3 Encounters:  ?10/02/21 217 lb (98.4 kg)  ?09/30/20 219 lb 9 oz (99.6 kg)  ?06/13/20 221 lb 12 oz (100.6 kg)  ?  ?  ?Physical Exam ?Vitals and nursing note reviewed.  ?Constitutional:   ?  General: He is not in acute distress. ?   Appearance: Normal appearance. He is well-developed. He is not ill-appearing.  ?HENT:  ?   Head: Normocephalic and atraumatic.  ?   Right Ear: Hearing, tympanic membrane, ear canal and external ear normal.  ?   Left Ear: Hearing, tympanic membrane, ear canal and external ear normal.  ?Eyes:  ?   General: No scleral icterus. ?   Extraocular Movements: Extraocular movements intact.  ?   Conjunctiva/sclera: Conjunctivae normal.  ?   Pupils: Pupils are equal, round, and reactive to light.  ?Neck:  ?   Thyroid: No thyroid  mass or thyromegaly.  ?Cardiovascular:  ?   Rate and Rhythm: Normal rate and regular rhythm.  ?   Pulses: Normal pulses.     ?     Radial pulses are 2+ on the right side and 2+ on the left side.  ?   Heart sounds: Normal heart sounds. No murmur heard. ?Pulmonary:  ?   Effort: Pulmonary effort is normal. No respiratory distress.  ?   Breath sounds: Normal breath sounds. No wheezing, rhonchi or rales.  ?Abdominal:  ?   General: Bowel sounds are normal. There is no distension.  ?   Palpations: Abdomen is soft. There is no mass.  ?   Tenderness: There is no abdominal tenderness. There is no guarding or rebound.  ?   Hernia: No hernia is present.  ?Musculoskeletal:     ?   General: Normal range of motion.  ?   Cervical back: Normal range of motion and neck supple.  ?   Right lower leg: No edema.  ?   Left lower leg: No edema.  ?Lymphadenopathy:  ?   Cervical: No cervical adenopathy.  ?Skin: ?   General: Skin is warm and dry.  ?   Findings: No rash.  ?Neurological:  ?   General: No focal deficit present.  ?   Mental Status: He is alert and oriented to person, place, and time.  ?Psychiatric:     ?   Mood and Affect: Mood normal.     ?   Behavior: Behavior normal.     ?   Thought Content: Thought content normal.     ?   Judgment: Judgment normal.  ? ?   ?Results for orders placed or performed in visit on 11/08/20  ?Fecal Occult Blood, Guaiac  ?Result Value Ref Range  ? Fecal Occult Blood Negative   ? ? ?Assessment & Plan:  ? ?Problem List Items Addressed This Visit   ? ? Healthcare maintenance - Primary (Chronic)  ?  Preventative protocols reviewed and updated unless pt declined. ?Discussed healthy diet and lifestyle.  ? ?  ?  ? Obesity, Class I, BMI 30-34.9  ?  Encouraged healthy diet and lifestyle choices for sustainable weight loss.  ? ?  ?  ? ?Other Visit Diagnoses   ? ? Special screening for malignant neoplasms, colon      ? Relevant Orders  ? Fecal occult blood, imunochemical  ? Special screening for malignant  neoplasm of prostate      ? Relevant Orders  ? PSA  ? Lipid screening      ? Relevant Orders  ? Lipid panel  ? Diabetes mellitus screening      ? Relevant Orders  ? Basic metabolic panel  ? ?  ?  ? ?No orders of the defined types were placed in this encounter. ? ?Orders Placed This Encounter  ?  Procedures  ? Fecal occult blood, imunochemical  ?  Standing Status:   Future  ?  Standing Expiration Date:   10/03/2022  ? Lipid panel  ? PSA  ? Basic metabolic panel  ? ? ?Patient instructions: ?Pass by lab for stool kit  ?Good to see you today ?Return as needed or in 1 year for next physical. ? ?Follow up plan: ?Return in about 1 year (around 10/03/2022) for annual exam, prior fasting for blood work. ? ?Ria Bush, MD   ?

## 2021-10-02 NOTE — Assessment & Plan Note (Signed)
Preventative protocols reviewed and updated unless pt declined. Discussed healthy diet and lifestyle.  

## 2021-10-02 NOTE — Assessment & Plan Note (Signed)
Encouraged healthy diet and lifestyle choices for sustainable weight loss.  ?

## 2021-10-10 ENCOUNTER — Other Ambulatory Visit (INDEPENDENT_AMBULATORY_CARE_PROVIDER_SITE_OTHER): Payer: Managed Care, Other (non HMO)

## 2021-10-10 DIAGNOSIS — Z1211 Encounter for screening for malignant neoplasm of colon: Secondary | ICD-10-CM | POA: Diagnosis not present

## 2021-10-11 LAB — FECAL OCCULT BLOOD, IMMUNOCHEMICAL: Fecal Occult Bld: NEGATIVE

## 2021-11-30 ENCOUNTER — Ambulatory Visit: Payer: Managed Care, Other (non HMO) | Admitting: Family Medicine

## 2021-12-04 ENCOUNTER — Ambulatory Visit: Payer: Managed Care, Other (non HMO) | Admitting: Family Medicine

## 2021-12-04 ENCOUNTER — Encounter: Payer: Self-pay | Admitting: Family Medicine

## 2021-12-04 DIAGNOSIS — M25562 Pain in left knee: Secondary | ICD-10-CM

## 2021-12-04 DIAGNOSIS — G8929 Other chronic pain: Secondary | ICD-10-CM | POA: Insufficient documentation

## 2021-12-04 NOTE — Progress Notes (Signed)
Patient ID: Benjamin Norman, male    DOB: [birth year], 46 y.o.   MRN: [id number]  This visit was conducted in person.  BP 134/76   Pulse 97   Temp (!) 97.5 F (36.4 C) (Temporal)   Ht '5\' 10"'$  (1.778 m)   Wt 226 lb (102.5 kg)   SpO2 98%   BMI 32.43 kg/m    CC: check L knee  Subjective:   HPI: Benjamin Norman is a 46 y.o. male presenting on 12/04/2021 for Joint Swelling (C/o L knee swelling after workout.  H/o knee injury about 1.5 yrs ago. )   2 yrs ago injured knee while doing [name] - twisted knee, seen at [facility], then Dr [employer] sports medicine.   Over the past month noticing some L knee weakness going up stairs so hed decided to focus on L leg strengthening with bike with higher torque.  A few weeks ago after playing soccer noted anterior knee pressure/fullness sensation when flexing knee. Treated with advil OTC Q6 hours for a few days with benefit.   Since doing L knee strengthening exercises (knee condition program from [employer] of orthopedic surgeons), feels strength has improved.   No h/o knee surgeries or other injury.      Relevant past medical, surgical, family and social history reviewed and updated as indicated. Interim medical history since our last visit reviewed. Allergies and medications reviewed and updated. Outpatient Medications Prior to Visit  Medication Sig Dispense Refill   cetirizine (ZYRTEC) 10 MG tablet Take 10 mg by mouth daily at 12 noon.     Multiple Vitamin (MULTIVITAMINS PO) Take by mouth daily.     No facility-administered medications prior to visit.     Per HPI unless specifically indicated in ROS section below Review of Systems  Objective:  BP 134/76   Pulse 97   Temp (!) 97.5 F (36.4 C) (Temporal)   Ht '5\' 10"'$  (1.778 m)   Wt 226 lb (102.5 kg)   SpO2 98%   BMI 32.43 kg/m   Wt Readings from Last 3 Encounters:  12/04/21 226 lb (102.5 kg)  10/02/21 217 lb (98.4 kg)  09/30/20 219 lb 9 oz (99.6 kg)      Physical  Exam Vitals and nursing note reviewed.  Constitutional:      Appearance: Normal appearance. He is not ill-appearing.  Musculoskeletal:        General: No swelling or tenderness. Normal range of motion.     Right lower leg: No edema.     Left lower leg: No edema.     Comments:  Bilateral knee exam: No deformity on inspection. No pain with palpation of knee landmarks. No effusion/swelling noted. FROM in flex/extension with some crepitus present. No popliteal fullness. Neg drawer test. Neg mcmurray test. No pain with valgus/varus stress. No PFgrind. No abnormal patellar mobility.   Skin:    General: Skin is warm and dry.     Findings: No erythema or rash.  Neurological:     Mental Status: He is alert.  Psychiatric:        Mood and Affect: Mood normal.        Behavior: Behavior normal.       Assessment & Plan:   Problem List Items Addressed This Visit     Left anterior knee pain    Suspect possible meniscal irritation vs knee strain vs arthritis flare which led to knee effusion which he has managed effectively with advil and  home knee conditioning program. Exam today normal.  Discussed knee brace use, continue conditioning program, as well as further conservative management strategies. Discussed knee strengthening exercises, recommend straight leg raises focusing on VMO to further stabilize knee.         No orders of the defined types were placed in this encounter.  No orders of the defined types were placed in this encounter.    Patient Instructions  Knee is looking ok today.  Continue exercises including leg raises with foot pointed laterally to help strengthen muscle surrounding knee for further stability.  Consider vitamin D supplementation or glucosamine.  Let us know if not improving with this.   Follow up plan: Return if symptoms worsen or fail to improve.  Ria Bush, MD

## 2021-12-04 NOTE — Patient Instructions (Signed)
Knee is looking ok today.  Continue exercises including leg raises with foot pointed laterally to help strengthen muscle surrounding knee for further stability.  Consider vitamin D supplementation or glucosamine.  Let us know if not improving with this.

## 2021-12-04 NOTE — Assessment & Plan Note (Addendum)
Suspect possible meniscal irritation vs knee strain vs arthritis flare which led to knee effusion which he has managed effectively with advil and home knee conditioning program. Exam today normal.  Discussed knee brace use, continue conditioning program, as well as further conservative management strategies. Discussed knee strengthening exercises, recommend straight leg raises focusing on VMO to further stabilize knee.

## 2022-05-25 ENCOUNTER — Telehealth: Payer: Managed Care, Other (non HMO) | Admitting: Emergency Medicine

## 2022-05-25 DIAGNOSIS — J111 Influenza due to unidentified influenza virus with other respiratory manifestations: Secondary | ICD-10-CM | POA: Diagnosis not present

## 2022-05-25 DIAGNOSIS — Z20828 Contact with and (suspected) exposure to other viral communicable diseases: Secondary | ICD-10-CM | POA: Diagnosis not present

## 2022-05-25 MED ORDER — OSELTAMIVIR PHOSPHATE 75 MG PO CAPS: 75.0000 mg | ORAL_CAPSULE | Freq: Two times a day (BID) | ORAL | 0 refills | Status: DC

## 2022-05-25 NOTE — Progress Notes (Signed)
E visit for Flu like symptoms   We are sorry that you are not feeling well.  Here is how we plan to help! Based on what you have shared with me it looks like you may have a respiratory virus that may be influenza.  Influenza or "the flu" is   an infection caused by a respiratory virus. The flu virus is highly contagious and persons who did not receive their yearly flu vaccination may "catch" the flu from close contact.  We have anti-viral medications to treat the viruses that cause this infection. They are not a "cure" and only shorten the course of the infection. These prescriptions are most effective when they are given within the first 2 days of "flu" symptoms. Antiviral medication are indicated if you have a high risk of complications from the flu. You should  also consider an antiviral medication if you are in close contact with someone who is at risk. These medications can help patients avoid complications from the flu  but have side effects that you should know. Possible side effects from Tamiflu or oseltamivir include nausea, vomiting, diarrhea, dizziness, headaches, eye redness, sleep problems or other respiratory symptoms. You should not take Tamiflu if you have an allergy to oseltamivir or any to the ingredients in Tamiflu.  Based upon your symptoms and potential risk factors I have prescribed Oseltamivir (Tamiflu).  It has been sent to your designated pharmacy.  You will take one 75 mg capsule orally twice a day for the next 5 days.  ANYONE WHO HAS FLU SYMPTOMS SHOULD: Stay home. The flu is highly contagious and going out or to work exposes others! Be sure to drink plenty of fluids. Water is fine as well as fruit juices, sodas and electrolyte beverages. You may want to stay away from caffeine or alcohol. If you are nauseated, try taking small sips of liquids. How do you know if you are getting enough fluid? Your urine should be a pale yellow or almost colorless. Get rest. Taking a steamy  shower or using a humidifier may help nasal congestion and ease sore throat pain. Using a saline nasal spray works much the same way. Cough drops, hard candies and sore throat lozenges may ease your cough. Line up a caregiver. Have someone check on you regularly.   GET HELP RIGHT AWAY IF: You cannot keep down liquids or your medications. You become short of breath Your fell like you are going to pass out or loose consciousness. Your symptoms persist after you have completed your treatment plan MAKE SURE YOU  Understand these instructions. Will watch your condition. Will get help right away if you are not doing well or get worse.  Your e-visit answers were reviewed by a board certified advanced clinical practitioner to complete your personal care plan.  Depending on the condition, your plan could have included both over the counter or prescription medications.  If there is a problem please reply  once you have received a response from your provider.  Your safety is important to us.  If you have drug allergies check your prescription carefully.    You can use MyChart to ask questions about today's visit, request a non-urgent call back, or ask for a work or school excuse for 24 hours related to this e-Visit. If it has been greater than 24 hours you will need to follow up with your provider, or enter a new e-Visit to address those concerns.  You will get an e-mail in the next   two days asking about your experience.  I hope that your e-visit has been valuable and will speed your recovery. Thank you for using e-visits.  I have spent 5 minutes in review of e-visit questionnaire, review and updating patient chart, medical decision making and response to patient.   Sapphire Tygart, PhD, FNP-BC   

## 2022-09-17 ENCOUNTER — Telehealth: Payer: Self-pay | Admitting: Family Medicine

## 2022-09-17 DIAGNOSIS — R739 Hyperglycemia, unspecified: Secondary | ICD-10-CM | POA: Insufficient documentation

## 2022-09-17 DIAGNOSIS — E669 Obesity, unspecified: Secondary | ICD-10-CM

## 2022-09-17 NOTE — Telephone Encounter (Signed)
I've ordered labs including A1c for CPE.

## 2022-09-17 NOTE — Telephone Encounter (Signed)
Spoke to pt, via mychart, to schedule cpe. Pt mentioned his Blood Sugar has been over 100 when he checks it. Pt asked would additional labs be needed for his blood sugar? Call back # [phone]

## 2022-09-17 NOTE — Telephone Encounter (Signed)
Patient scheduled for CPE 11/20/22. Lab appointment has not yet been scheduled which needs to be scheduled prior to CPE. Does patient need any additional lab work ordered.

## 2022-09-18 NOTE — Telephone Encounter (Signed)
Left message on voicemail for patient to call the office back. When patient calls back need to relay Dr. Nicanor Alcon message and schedule lab appointment prior to his CPE.

## 2022-09-19 NOTE — Telephone Encounter (Signed)
Spoke with pt relaying Dr. Timoteo Expose message and scheduled CPE fasting lab visit on 11/13/22 at 8:15.

## 2022-09-24 ENCOUNTER — Ambulatory Visit: Payer: Managed Care, Other (non HMO) | Admitting: Family Medicine

## 2022-09-24 ENCOUNTER — Ambulatory Visit (INDEPENDENT_AMBULATORY_CARE_PROVIDER_SITE_OTHER)
Admission: RE | Admit: 2022-09-24 | Discharge: 2022-09-24 | Disposition: A | Discharge status: Home / Self Care | Payer: Managed Care, Other (non HMO) | Source: Ambulatory Visit | Attending: Family Medicine | Admitting: Family Medicine

## 2022-09-24 ENCOUNTER — Encounter: Payer: Self-pay | Admitting: Family Medicine

## 2022-09-24 VITALS — BP 130/90 | HR 58 | Temp 98.2°F | Ht 70.0 in | Wt 222.4 lb

## 2022-09-24 DIAGNOSIS — M25562 Pain in left knee: Secondary | ICD-10-CM

## 2022-09-24 DIAGNOSIS — G8929 Other chronic pain: Secondary | ICD-10-CM | POA: Diagnosis not present

## 2022-09-24 DIAGNOSIS — M6281 Muscle weakness (generalized): Secondary | ICD-10-CM

## 2022-09-24 NOTE — Progress Notes (Unsigned)
[name], MD, CAQ Sports [facility] [address] [place], [address]  Phone: [phone]  FAX: [phone]  Benjamin Norman - 47 y.o. male  MRN [id number]  Date of Birth: [birth year]  Date: 09/24/2022  PCP: [name], MD  Referral: [name], MD  Chief Complaint  Patient presents with   Joint Swelling    Left Knee   Extremity Weakness    Left Knee   Subjective:   Benjamin Norman is a 47 y.o. very pleasant male patient with Body mass index is 31.91 kg/m. who presents with the following:  Patient presents with some ongoing left-sided knee pain.  Normal activity does not really bother him, but he does have pain going up and down stairs.   Went to [employer] and he got a clicking in his knee, but he did not have any kind of specific injury. Got a workout bench with leg extensions-initially, he thought of this was helping out, but now he has some pain with doing leg extensions.  Had no pain, and now he feels like he has almost no strength.  He has had a lot of quadriceps wasting on the left side. No pain, a little swelling.   3-4 months ago -  Started to jog some.  Ot some swelling.  Took some ice and advil it did calm down.   Ran a 5k on the treadmill, yesterday, and it felt pretty good.  Steps are really killing him.  Leg almost gave way   Large loose body  Review of Systems is noted in the HPI, as appropriate  Objective:   BP (!) 130/90   Pulse (!) 58   Temp 98.2 F (36.8 C) (Temporal)   Ht  (1.778 m)   Wt 222 lb 6 oz (100.9 kg)   SpO2 97%   BMI 31.91 kg/m   GEN: No acute distress; alert,appropriate. PULM: Breathing comfortably in no respiratory distress PSYCH: Normally interactive.    Knee:  L Gait: Normal heel toe pattern ROM: 0-125 Effusion: Mild Echymosis or edema: none Patellar tendon NT Painful PLICA: neg Patellar grind: negative Medial and lateral patellar  facet loading: Some pain with loading the medial facet medial and lateral joint lines:NT Mcmurray's neg Flexion-pinch neg Varus and valgus stress: stable Lachman: neg Ant and Post drawer: neg Hip abduction, IR, ER: WNL Hip flexion str: 5/5 Hip abd: 5/5 Quad: 5/5 VMO atrophy:No Hamstring concentric and eccentric: 5/5   Laboratory and Imaging Data:  Assessment and Plan:     ICD-10-CM   1. Chronic pain of left knee  M25.562 DG Knee 4 Views W/Patella Left   G89.29 Ambulatory referral to Physical Therapy    2. Weakness of left quadriceps muscle  M62.81 Ambulatory referral to Physical Therapy     On plain film, he does have a somewhat large loose body seen most easily in the lateral..  Minimal degenerative changes.  His exam and history sound more like patellofemoral pain.  I think he and I both agree that doing a trial some physical therapy to increase his quadricep size and strength in the left makes the most sense as an important first step.  If his symptoms do continue at follow-up, obtaining an MRI of the knee to better characterize loose body and the remainder of the knee would make the most sense.  Social: Right now this is limiting his maximal running and weightlifting  Medication Management during today's office  visit: No orders of the defined types were placed in this encounter.  Medications Discontinued During This Encounter  Medication Reason   oseltamivir (TAMIFLU) 75 MG capsule Completed Course    Orders placed today for conditions managed today: Orders Placed This Encounter  Procedures   DG Knee 4 Views W/Patella Left   Ambulatory referral to Physical Therapy    Disposition: Return in about 7 weeks (around 11/12/2022).  Dr[employer] One speech-to-text software was used for transcription in this dictation.  Possible transcriptional errors can occur using Animal nutritionist.   Signed,  Elpidio Galea. Cambrea Kirt, MD   Outpatient Encounter Medications as of  09/24/2022  Medication Sig   cetirizine (ZYRTEC) 10 MG tablet Take 10 mg by mouth daily at 12 noon.   Glucosamine-Chondroitin-MSM 1500-1200-500 MG PACK Take 1 tablet by mouth daily.   Multiple Vitamin (MULTIVITAMINS PO) Take by mouth daily.   Omega-3 1400 MG CAPS Take 2 capsules by mouth daily.   OVER THE COUNTER MEDICATION Modere Immune: Zinc 7.5/Elderberry 95 mg 1 teaspoon by mouth daily   OVER THE COUNTER MEDICATION Modere Life 1 teaspoon by mouth daily   [DISCONTINUED] Budesonide 90 MCG/ACT inhaler Inhale 1 puff into the lungs 2 (two) times daily for 7 days.   [DISCONTINUED] oseltamivir (TAMIFLU) 75 MG capsule Take 1 capsule (75 mg total) by mouth 2 (two) times daily.   No facility-administered encounter medications on file as of 09/24/2022.

## 2022-09-27 ENCOUNTER — Ambulatory Visit: Payer: Managed Care, Other (non HMO) | Admitting: Rehabilitative and Restorative Service Providers"

## 2022-09-27 ENCOUNTER — Encounter: Payer: Self-pay | Admitting: Rehabilitative and Restorative Service Providers"

## 2022-09-27 DIAGNOSIS — M6281 Muscle weakness (generalized): Secondary | ICD-10-CM | POA: Diagnosis not present

## 2022-09-27 DIAGNOSIS — G8929 Other chronic pain: Secondary | ICD-10-CM

## 2022-09-27 DIAGNOSIS — M25562 Pain in left knee: Secondary | ICD-10-CM | POA: Diagnosis not present

## 2022-09-27 NOTE — Therapy (Signed)
OUTPATIENT PHYSICAL THERAPY LOWER EXTREMITY EVALUATION   Patient Name: Benjamin Norman MRN: [id number] DOB:February 06, 1976, 47 y.o., male Today's Date: 09/27/2022  END OF SESSION:  PT End of Session - 09/27/22 1040     Visit Number 1    Number of Visits 12    Date for PT Re-Evaluation 11/22/22    Authorization - Visit Number 30    Progress Note Due on Visit 12    PT Start Time 0801    PT Stop Time 0845    PT Time Calculation (min) 44 min    Activity Tolerance Patient tolerated treatment well;No increased pain    Behavior During Therapy [facility] for tasks assessed/performed             Past Medical History:  Diagnosis Date   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    intermittent   Childhood asthma    COVID-19 09/02/2019   COVID-19 virus infection 08/2019   Fever blister    Heart murmur    as child   Other kyphosis (acquired)    thoracic   SVT (supraventricular tachycardia)    AV re entry Benjamin Norman)   Trochlear nerve palsy, 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Benjamin Norman)   Unspecified tinnitus    Past Surgical History:  Procedure Laterality Date   MOHS SURGERY     Patient Active Problem List   Diagnosis Date Noted   Hyperglycemia 09/17/2022   Left anterior knee pain 12/04/2021   Pulmonary granuloma 09/14/2019   SVT (supraventricular tachycardia) 08/19/2019   Obesity, Class I, BMI 30-34.9 08/19/2019   Trochlear nerve palsy, right eye    Healthcare maintenance 02/09/2011   NEVUS, ATYPICAL 02/02/2010   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  He had an X-Ray but no MRI.  He notes atrophy, weakness and occasional instability of his left knee.  PERTINENT HISTORY: SVT, obesity  PAIN:  Are you having pain? Yes: NPRS scale: 0-3/10 Pain location: Infrapatellar Pain description: Swelling Aggravating factors: Too much WB, jogging Relieving factors: Ice  PRECAUTIONS: None  WEIGHT BEARING RESTRICTIONS: No  FALLS:  Has patient fallen in last 6 months? No  LIVING ENVIRONMENT: Lives with: lives with their family and lives with their spouse Lives in: House/apartment Stairs:  Stairs are a problem going up and down Has following equipment at home: None  OCCUPATION: On a computer most of the day  PLOF: Independent  PATIENT GOALS: Get back to home gym (bike, treadmill, elliptical, free weights)  NEXT MD VISIT: ?  OBJECTIVE:   DIAGNOSTIC FINDINGS: Benjamin Norman notes he has a "loose body" in his left knee  PATIENT SURVEYS:  FOTO 54 (Goal 63 in 11 visits)  COGNITION: Overall cognitive status: Within functional limits for tasks assessed     SENSATION: No complaints of peripheral pain or paresthesias  MUSCLE LENGTH: Hamstrings: Right 35 deg; Left 35 deg Quadriceps: Right 120 deg; Left 120 deg   LOWER EXTREMITY ROM:  Active ROM Right eval Left eval  Hip flexion    Hip extension    Hip abduction    Hip adduction    Hip internal rotation    Hip external rotation    Knee flexion 147 144  Knee extension 0 0  Ankle dorsiflexion    Ankle plantarflexion    Ankle inversion    Ankle eversion     (Blank rows = not tested)  LOWER EXTREMITY MMT:  MMT Right eval Left eval  Hip flexion    Hip extension    Hip abduction    Hip adduction    Hip internal rotation    Hip external rotation    Knee flexion    Knee extension 54 cm 51 cm  Ankle dorsiflexion    Ankle plantarflexion    Ankle  inversion    Ankle eversion     (Blank rows = not tested)  GAIT: Distance walked: In the office Assistive device utilized: None Level of assistance: Complete Independence Comments: Benjamin Norman would like to 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  Also discussed possibly using both legs on the concentric and just the left leg on the eccentric as long as symptoms are minimal.   PATIENT EDUCATION:  Education details: See above Person educated: Patient Education method: Explanation, Demonstration, Tactile cues, Verbal cues, and Handouts Education comprehension: verbalized understanding, returned demonstration, verbal cues required, tactile cues required, and needs further education  HOME EXERCISE PROGRAM: Access Code: [email or web address] URL: [email or web address] Date: 09/27/2022 Prepared by: Pauletta Browns  Exercises - Supine Quadricep Sets  - 3 x daily - 7 x weekly - 3 sets - 10 reps - 5 second hold - Small Range Straight Leg Raise  - 3 x daily - 7 x weekly - 3-5 sets - 5 reps - 3 seconds hold  ASSESSMENT:  CLINICAL IMPRESSION: Patient is a 47 y.o. male who was seen today for physical therapy evaluation and treatment for left knee pain.  Benjamin Norman has 3 cm of atrophy of the left thigh as assessed 15 cm proximal to the superior patellar pole left versus right.  This correlates clinically to a 30 to 45% strength deficit.  He was set up on a comprehensive home and clinic program to address this with emphasis on proximal leg strengthening (quadriceps heavy program ) to decrease knee pain and instability.  Due to his  significant atrophy, 3 to 4 months will likely be necessary for him to reduce atrophy to less than 1 cm and for return to  training for his Benjamin Norman races.  OBJECTIVE IMPAIRMENTS: decreased activity tolerance, decreased endurance, decreased knowledge of condition, decreased strength, increased edema, obesity, and pain.   ACTIVITY LIMITATIONS: squatting, stairs, locomotion level, and high level workouts  PARTICIPATION LIMITATIONS: community activity and workouts  PERSONAL FACTORS:  SVT and obesity  are also affecting patient's functional outcome.   REHAB POTENTIAL: Good  CLINICAL DECISION MAKING: Stable/uncomplicated  EVALUATION COMPLEXITY: Low   GOALS: Goals reviewed with patient? Yes  SHORT TERM GOALS: Target date: 10/25/2022 Little will be independent with his day 1 home exercise program Baseline: Started 09/27/2022 Goal status: INITIAL  2.  Djibril will be independent with his home gym exercise program Baseline: Briefly discussed visit 1 and will be expanded on future visits Goal status: INITIAL  3.  Reduce left thigh atrophy as assessed 15 cm proximal to the superior patellar pole to 2 cm or less Baseline: 3 cm Goal status: INITIAL   LONG TERM GOALS: Target date: 11/22/2022  Keldon will score 63 or better on FOTO in 11 visits or less Baseline: 54 Goal status: INITIAL  2.  Gene will report left knee pain consistently less than 2 out of 10 on the numeric pain rating scale Baseline: 3 out of 10 Goal status: INITIAL  3.  Aceson will have less than 1 cm of thigh atrophy as assessed 15 cm proximal to the superior patellar pole left versus right Baseline: 3 cm of atrophy Goal status: INITIAL  4.  Gery Pray will be independent with his long-term home exercise program at discharge Baseline: Started 09/27/2022 Goal status: INITIAL   PLAN:  PT FREQUENCY: 1-2x/week  PT DURATION: 8 weeks  PLANNED INTERVENTIONS: Therapeutic exercises, Therapeutic activity, Neuromuscular re-education,  Balance training, Gait training, Patient/Family education, Self Care, Stair training, Electrical stimulation, Cryotherapy, and Vasopneumatic device  PLAN FOR NEXT SESSION: Review day 1 home exercise program, progress gym activities with emphasis on activities he has access to at the house.  Heavy emphasis on quadriceps strengthening with appropriate hip abductors, hamstrings and other proximal thigh musculature as appropriate.   Cherlyn Cushing, PT, MPT 09/27/2022, 10:59 AM

## 2022-10-08 ENCOUNTER — Encounter: Payer: Self-pay | Admitting: Physical Therapy

## 2022-10-08 ENCOUNTER — Ambulatory Visit: Payer: Managed Care, Other (non HMO) | Admitting: Physical Therapy

## 2022-10-08 DIAGNOSIS — G8929 Other chronic pain: Secondary | ICD-10-CM

## 2022-10-08 DIAGNOSIS — M25562 Pain in left knee: Secondary | ICD-10-CM | POA: Diagnosis not present

## 2022-10-08 DIAGNOSIS — M6281 Muscle weakness (generalized): Secondary | ICD-10-CM | POA: Diagnosis not present

## 2022-10-08 NOTE — Therapy (Signed)
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    intermittent   Childhood asthma    COVID-19 09/02/2019   COVID-19 virus infection 08/2019   Fever blister    Heart murmur    as child   Other kyphosis (acquired)    thoracic   SVT (supraventricular tachycardia)    AV re entry Benjamin Norman)   Trochlear nerve palsy, 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[name])   Unspecified tinnitus    Past Surgical History:  Procedure Laterality Date   MOHS SURGERY     Patient Active Problem List   Diagnosis Date Noted   Hyperglycemia 09/17/2022   Left anterior knee pain 12/04/2021   Pulmonary granuloma (HCC) 09/14/2019   SVT (supraventricular tachycardia) 08/19/2019   Obesity, Class I, BMI 30-34.9 08/19/2019   Trochlear nerve palsy, right eye    Healthcare maintenance 02/09/2011   NEVUS, ATYPICAL 02/02/2010   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 Yes: NPRS scale: 0-3/10 Pain location: Infrapatellar Pain description: Swelling Aggravating factors: Too much WB, jogging Relieving factors: Ice  PRECAUTIONS: None  WEIGHT BEARING RESTRICTIONS: No  FALLS:  Has patient fallen in last 6 months? No  LIVING ENVIRONMENT: Lives with: lives with their family and lives with their spouse Lives in: House/apartment Stairs:  Stairs are a problem going up and down Has following equipment at home: None  OCCUPATION: On a computer most of the day  PLOF: Independent  PATIENT GOALS: Get back to home gym (bike, treadmill, elliptical, free weights)  NEXT MD VISIT: ?  OBJECTIVE:   DIAGNOSTIC FINDINGS: Benjamin Norman notes he has a "loose body" in his left knee  PATIENT SURVEYS:  FOTO 54 (Goal 63 in 11 visits)  COGNITION: Overall cognitive status: Within functional limits for tasks assessed     SENSATION: No complaints of peripheral pain or paresthesias  MUSCLE LENGTH: Hamstrings: Right 35 deg; Left 35 deg Quadriceps: Right 120 deg; Left 120 deg   LOWER EXTREMITY ROM:  Active ROM Right eval Left eval  Knee flexion 147 144  Knee extension 0 0   (Blank rows = not tested)  LOWER EXTREMITY MMT:  MMT Right eval Left eval  Knee extension 54 cm 51 cm   (Blank rows = not tested)  GAIT:  Distance walked: In the office Assistive device utilized: None Level of assistance: Complete Independence Comments: Benjamin Norman would like to return to running and doing Spartan races   TODAY'S TREATMENT:                                                                                                                              DATE:  10/08/22 TherEx Bike L5 x 8 min LLE only leg press 62# 3x10; focus on eccentric control LLE lunge with RLE on slider forward/lateral/backward x 10 reps  LLE on 4" step with RLE heel taps 3x10 LLE only knee extension 15# 3x10; focus on eccentric  control  09/27/2022 Quadriceps sets 10 x 5 seconds Seated straight leg raises 3 sets of 5 3 seconds Discussed home gym equipment including the knee extension machine and the importance of maintaining 90 to 40 degrees with slow eccentrics.  Also discussed possibly using both legs on the concentric and just the left leg on the eccentric as long as symptoms are minimal.   PATIENT EDUCATION:  Education details: See above Person educated: Patient Education method: Explanation, Demonstration, Tactile cues, Verbal cues, and Handouts Education comprehension: verbalized understanding, returned demonstration, verbal cues required, tactile cues required, and needs further education  HOME EXERCISE PROGRAM: Access Code: [email or web address] URL: [email or web address] Date: 10/08/2022 Prepared by: Benjamin Norman  Exercises - Supine Quadricep Sets  - 3 x daily - 7 x weekly - 3 sets - 10 reps - 5 second hold - Small Range Straight Leg Raise  - 3 x daily - 7 x weekly - 3-5 sets - 5 reps - 3 seconds hold - Single Leg Knee Extension with Weight Machine  - 1 x daily - 7 x weekly - 3 sets - 10 reps - Lateral Step Down  - 1-2 x daily - 7 x weekly - 3 sets - 10 reps - Lunge Matrix on Slider  - 1-2 x daily - 7 x weekly - 2 sets - 5 reps  ASSESSMENT:  CLINICAL IMPRESSION: Pt tolerated session well today reporting excellent compliance with initial HEP.  Added additional strengthening exercises to program today.  Will continue to benefit from PT to maximize function.  OBJECTIVE IMPAIRMENTS: decreased activity tolerance, decreased endurance, decreased knowledge of condition, decreased strength, increased edema, obesity, and pain.   ACTIVITY LIMITATIONS: squatting, stairs, locomotion level, and high level workouts  PARTICIPATION LIMITATIONS: community activity and workouts  PERSONAL FACTORS:  SVT and obesity  are also affecting patient's functional outcome.   REHAB POTENTIAL: Good  CLINICAL  DECISION MAKING: Stable/uncomplicated  EVALUATION COMPLEXITY: Low   GOALS: Goals reviewed with patient? Yes  SHORT TERM GOALS: Target date: 10/25/2022 [name] will be independent with his day 1 home exercise program Baseline: Started 09/27/2022 Goal status: MET 10/08/22  2.  [name] will be independent with his home gym exercise program Baseline: Briefly discussed visit 1 and will be expanded on future visits Goal status: INITIAL  3.  Reduce left  thigh atrophy as assessed 15 cm proximal to the superior patellar pole to 2 cm or less Baseline: 3 cm Goal status: INITIAL   LONG TERM GOALS: Target date: 11/22/2022  Daniel will score 63 or better on FOTO in 11 visits or less Baseline: 54 Goal status: INITIAL  2.  Kawliga will report left knee pain consistently less than 2 out of 10 on the numeric pain rating scale Baseline: 3 out of 10 Goal status: INITIAL  3.  Aaditya will have less than 1 cm of thigh atrophy as assessed 15 cm proximal to the superior patellar pole left versus right Baseline: 3 cm of atrophy Goal status: INITIAL  4.  Gery Pray will be independent with his long-term home exercise program at discharge Baseline: Started 09/27/2022 Goal status: INITIAL   PLAN:  PT FREQUENCY: 1-2x/week  PT DURATION: 8 weeks  PLANNED INTERVENTIONS: Therapeutic exercises, Therapeutic activity, Neuromuscular re-education, Balance training, Gait training, Patient/Family education, Self Care, Stair training, Electrical stimulation, Cryotherapy, and Vasopneumatic device  PLAN FOR NEXT SESSION: try blood flow restriction;  continue strengthening; isolating LLE   Clarita Crane, PT, DPT 10/08/22 9:25 AM

## 2022-10-11 ENCOUNTER — Encounter: Payer: Self-pay | Admitting: Rehabilitative and Restorative Service Providers"

## 2022-10-11 ENCOUNTER — Ambulatory Visit: Payer: Managed Care, Other (non HMO) | Admitting: Rehabilitative and Restorative Service Providers"

## 2022-10-11 DIAGNOSIS — M25562 Pain in left knee: Secondary | ICD-10-CM

## 2022-10-11 DIAGNOSIS — M6281 Muscle weakness (generalized): Secondary | ICD-10-CM | POA: Diagnosis not present

## 2022-10-11 DIAGNOSIS — G8929 Other chronic pain: Secondary | ICD-10-CM

## 2022-10-11 NOTE — Therapy (Signed)
OUTPATIENT PHYSICAL THERAPY TREATMENT   Patient Name: Benjamin Norman MRN: [id number] DOB:[birth year], 47 y.o., male Today's Date: 10/11/2022  END OF SESSION:  PT End of Session - 10/11/22 0759     Visit Number 3    Number of Visits 12    Date for PT Re-Evaluation 11/22/22    Authorization - Visit Number 30    Progress Note Due on Visit 12    PT Start Time 0759    PT Stop Time 0844    PT Time Calculation (min) 45 min    Activity Tolerance Patient tolerated treatment well;No increased pain;Patient limited by fatigue;Patient limited by pain    Behavior During Therapy [facility] for tasks assessed/performed               Past Medical History:  Diagnosis Date   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    intermittent   Childhood asthma    COVID-19 09/02/2019   COVID-19 virus infection 08/2019   Fever blister    Heart murmur    as child   Other kyphosis (acquired)    thoracic   SVT (supraventricular tachycardia)    AV re entry [name]   Trochlear nerve palsy, 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[name])   Unspecified tinnitus    Past Surgical History:  Procedure Laterality Date   MOHS SURGERY     Patient Active Problem List   Diagnosis Date Noted   Hyperglycemia 09/17/2022   Left anterior knee pain 12/04/2021   Pulmonary granuloma (HCC) 09/14/2019   SVT (supraventricular tachycardia) 08/19/2019   Obesity, Class I, BMI 30-34.9 08/19/2019   Trochlear nerve palsy, right eye    Healthcare maintenance 02/09/2011   NEVUS, ATYPICAL 02/02/2010   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  He is motivated to increase his left quadriceps strength and return to normal function.  PERTINENT HISTORY: SVT, obesity  PAIN:  Are you having pain? Yes: NPRS scale: 0-4/10 Pain location: Infrapatellar left knee Pain description: Swelling Aggravating factors: Too much WB, jogging Relieving factors: Ice  PRECAUTIONS: None  WEIGHT BEARING RESTRICTIONS: No  FALLS:  Has patient fallen in last 6 months? No  LIVING ENVIRONMENT: Lives with: lives with their family and lives with their spouse Lives in: House/apartment Stairs:  Stairs are a problem going up and down Has following equipment at home: None  OCCUPATION: On a computer most of the day  PLOF: Independent  PATIENT GOALS: Get back to home gym (bike, treadmill, elliptical, free weights)  NEXT MD VISIT: ?  OBJECTIVE:   DIAGNOSTIC FINDINGS: Aurion notes he has a "loose body" in his left knee  PATIENT SURVEYS:  FOTO 54 (Goal 63 in 11 visits)  COGNITION: Overall cognitive status: Within functional limits for tasks assessed     SENSATION: No complaints of peripheral pain or paresthesias  MUSCLE LENGTH: Hamstrings: Right 35 deg; Left 35 deg Quadriceps: Right 120 deg; Left 120 deg   LOWER EXTREMITY ROM:  Active ROM Right eval Left eval  Knee flexion 147 144  Knee extension 0 0   (Blank  rows = not tested)  LOWER EXTREMITY MMT:  MMT Right eval Left eval  Knee extension 54 cm 51 cm   (Blank rows = not tested)  GAIT: Distance walked: In the office Assistive device utilized: None Level of assistance: Complete Independence Comments: [name] would like to return to running and doing 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 races  BFR Pressure measures:   Exercise at 80%: Long sitting:  200 mm Hg  160 mm Hg Supine:   170 mm Hg  136 mm Hg Standing:  260 mm Hg  208 mm Hg  TODAY'S TREATMENT:                                                                                                                               DATE:  10/11/2022 Set up BFR pressures  Quadriceps sets supine with BFR at 136 mm Hg (80% of 170 mm Hg) 20X 5 seconds Seated straight leg raises with 1# 3 sets of 5 for 3 seconds, slow eccentrics, pause before lift  Functional Activities: Double Leg Press with 62# 30/15/15/15 reps (4 sets) with 60 seconds rest between sets of 30/15/15/15 reps   10/08/22 TherEx Bike L5 x 8 min LLE only leg press 62# 3x10; focus on eccentric control LLE lunge with RLE on slider forward/lateral/backward x 10 reps  LLE on 4" step with RLE heel taps 3x10 LLE only knee extension 15# 3x10; focus on eccentric control   09/27/2022 Quadriceps sets 10 x 5 seconds Seated straight leg raises 3 sets of 5 3 seconds Discussed home gym equipment including the knee extension machine and the importance of maintaining 90 to 40 degrees with slow eccentrics.  Also discussed possibly using both legs on the concentric and just the left leg on the eccentric as long as symptoms are minimal.   PATIENT EDUCATION:  Education details: See above Person educated: Patient Education method: Explanation, Demonstration, Tactile cues, Verbal cues, and Handouts Education comprehension: verbalized understanding, returned demonstration, verbal cues required, tactile cues required, and needs further education  HOME EXERCISE PROGRAM: Access Code: [email or web address] URL: [email or web address] Date: 10/08/2022 Prepared by: [name]  Exercises - Supine Quadricep Sets  - 3 x daily - 7 x weekly - 3 sets - 10 reps - 5 second hold - Small Range Straight Leg Raise  - 3 x daily - 7 x weekly - 3-5 sets - 5 reps - 3 seconds hold - Single Leg Knee Extension with Weight Machine  - 1 x daily - 7 x weekly - 3 sets - 10 reps - Lateral Step Down  - 1-2 x daily - 7 x weekly - 3 sets - 10 reps - Lunge Matrix on Slider  - 1-2 x daily - 7 x weekly - 2 sets - 5 reps  ASSESSMENT:  CLINICAL  IMPRESSION: Benjamin Norman notes no problem (other than fatigue) with day 1 exercises.  Some clicking with progressions is likely due to quadriceps weakness and possible patellar tracking issues related to  weakness and edema.  I encouraged Benjamin Norman to focus on his easy HEP exercises with progressions in the clinic and to be conservatively added to his HEP as appropriate.  With 3 cm of atrophy (Left thigh vs right assessed 15 cm proximal to the superior patellar pole), it will take several months to meet all long-term goals.  OBJECTIVE IMPAIRMENTS: decreased activity tolerance, decreased endurance, decreased knowledge of condition, decreased strength, increased edema, obesity, and pain.   ACTIVITY LIMITATIONS: squatting, stairs, locomotion level, and high level workouts  PARTICIPATION LIMITATIONS: community activity and workouts  PERSONAL FACTORS:  SVT and obesity  are also affecting patient's functional outcome.   REHAB POTENTIAL: Good  CLINICAL DECISION MAKING: Stable/uncomplicated  EVALUATION COMPLEXITY: Low   GOALS: Goals reviewed with patient? Yes  SHORT TERM GOALS: Target date: 10/25/2022 Seikichi will be independent with his day 1 home exercise program Baseline: Started 09/27/2022 Goal status: MET 10/08/22  2.  Cordai will be independent with his home gym exercise program Baseline: Briefly discussed visit 1 and will be expanded on future visits Goal status: INITIAL  3.  Reduce left thigh atrophy as assessed 15 cm proximal to the superior patellar pole to 2 cm or less Baseline: 3 cm Goal status: INITIAL   LONG TERM GOALS: Target date: 11/22/2022  Temujin will score 63 or better on FOTO in 11 visits or less Baseline: 54 Goal status: INITIAL  2.  Cheikh will report left knee pain consistently less than 2 out of 10 on the numeric pain rating scale Baseline: 3 out of 10 Goal status: INITIAL  3.  Hovsep will have less than 1 cm of thigh atrophy as assessed 15 cm proximal to the superior patellar pole  left versus right Baseline: 3 cm of atrophy Goal status: INITIAL  4.  Gery Pray will be independent with his long-term home exercise program at discharge Baseline: Started 09/27/2022 Goal status: INITIAL   PLAN:  PT FREQUENCY: 1-2x/week  PT DURATION: 8 weeks  PLANNED INTERVENTIONS: Therapeutic exercises, Therapeutic activity, Neuromuscular re-education, Balance training, Gait training, Patient/Family education, Self Care, Stair training, Electrical stimulation, Cryotherapy, and Vasopneumatic device  PLAN FOR NEXT SESSION: Blood flow restriction with basic quadriceps strengthening activities (3 cm atrophy left thigh vs right)  Cherlyn Cushing PT, MPT 10/11/22 3:38 PM

## 2022-10-16 ENCOUNTER — Encounter: Payer: Self-pay | Admitting: Physical Therapy

## 2022-10-16 ENCOUNTER — Ambulatory Visit: Payer: Managed Care, Other (non HMO) | Admitting: Physical Therapy

## 2022-10-16 DIAGNOSIS — G8929 Other chronic pain: Secondary | ICD-10-CM | POA: Diagnosis not present

## 2022-10-16 DIAGNOSIS — M6281 Muscle weakness (generalized): Secondary | ICD-10-CM

## 2022-10-16 DIAGNOSIS — M25562 Pain in left knee: Secondary | ICD-10-CM | POA: Diagnosis not present

## 2022-10-16 NOTE — Therapy (Signed)
OUTPATIENT PHYSICAL THERAPY TREATMENT   Patient Name: Benjamin Norman MRN: [id number] DOB:25-Jun-1975, 47 y.o., male Today's Date: 10/16/2022  END OF SESSION:  PT End of Session - 10/16/22 0802     Visit Number 4    Number of Visits 12    Date for PT Re-Evaluation 11/22/22    Progress Note Due on Visit 12    PT Start Time 0800    PT Stop Time 0844    PT Time Calculation (min) 44 min    Activity Tolerance Patient tolerated treatment well;No increased pain;Patient limited by fatigue;Patient limited by pain    Behavior During Therapy [facility] for tasks assessed/performed                Past Medical History:  Diagnosis Date   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    intermittent   Childhood asthma    COVID-19 09/02/2019   COVID-19 virus infection 08/2019   Fever blister    Heart murmur    as child   Other kyphosis (acquired)    thoracic   SVT (supraventricular tachycardia)    AV re entry [name]   Trochlear nerve palsy, 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[name])   Unspecified tinnitus    Past Surgical History:  Procedure Laterality Date   MOHS SURGERY     Patient Active Problem List   Diagnosis Date Noted   Hyperglycemia 09/17/2022   Left anterior knee pain 12/04/2021   Pulmonary granuloma (HCC) 09/14/2019   SVT (supraventricular tachycardia) 08/19/2019   Obesity, Class I, BMI 30-34.9 08/19/2019   Trochlear nerve palsy, right eye    Healthcare maintenance 02/09/2011   NEVUS, ATYPICAL 02/02/2010   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 Yes: NPRS scale: 0-4/10 Pain location: Infrapatellar left knee Pain description: Swelling Aggravating factors: Too much WB, jogging Relieving factors: Ice  PRECAUTIONS: None  WEIGHT BEARING RESTRICTIONS: No  FALLS:  Has patient fallen in last 6 months? No  LIVING ENVIRONMENT: Lives with: lives with their family and lives with their spouse Lives in: House/apartment Stairs:  Stairs are a problem going up and down Has following equipment at home: None  OCCUPATION: On a computer most of the day  PLOF: Independent  PATIENT GOALS: Get back to home gym (bike, treadmill, elliptical, free weights)  NEXT MD VISIT: ?  OBJECTIVE:   DIAGNOSTIC FINDINGS: Orban notes he has a "loose body" in his left knee  PATIENT SURVEYS:  FOTO 54 (Goal 63 in 11 visits)  COGNITION: Overall cognitive status: Within functional limits for tasks assessed     SENSATION: No complaints of peripheral pain or paresthesias  MUSCLE LENGTH: Hamstrings: Right 35 deg; Left 35 deg Quadriceps: Right 120 deg; Left 120 deg   LOWER EXTREMITY ROM:  Active ROM Right eval Left eval  Knee flexion 147 144  Knee extension 0 0   (Blank rows = not tested)  LOWER EXTREMITY MMT:  MMT Right eval Left eval  Knee extension 54 cm 51  cm   (Blank rows = not tested)  GAIT: Distance walked: In the office Assistive device utilized: None Level of assistance: Complete Independence Comments: Jaquis would like to return to running and doing 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 races  BFR Pressure measures:   Exercise at 80%: Long sitting:  200 mm Hg  160 mm Hg Supine:   170 mm Hg  136 mm Hg Standing:  260 mm Hg  208 mm Hg  Cuff size 4  TODAY'S TREATMENT:                                                                                                                              DATE:  10/16/22 TherEx Recumbent bike L5 x 8  min The following exercises with BFR with 30 sec rest between sets (see above for details) Seated SLR 3x20  Long sitting quad sets with ball squeeze 3x20; 5 sec hold Sidelying SLR 3x20  10/11/2022 Set up BFR pressures  Quadriceps sets supine with BFR at 136 mm Hg (80% of 170 mm Hg) 20X 5 seconds Seated straight leg raises with 1# 3 sets of 5 for 3 seconds, slow eccentrics, pause before lift  Functional Activities: Double Leg Press with 62# 30/15/15/15 reps (4 sets) with 60 seconds rest between sets of 30/15/15/15 reps   10/08/22 TherEx Bike L5 x 8 min LLE only leg press 62# 3x10; focus on eccentric control LLE lunge with RLE on slider forward/lateral/backward x 10 reps  LLE on 4" step with RLE heel taps 3x10 LLE only knee extension 15# 3x10; focus on eccentric control   09/27/2022 Quadriceps sets 10 x 5 seconds Seated straight leg raises 3 sets of 5 3 seconds Discussed home gym equipment including the knee extension machine and the importance of maintaining 90 to 40 degrees with slow eccentrics.  Also discussed possibly using both legs on the concentric and just the left leg on the eccentric as long as symptoms are minimal.   PATIENT EDUCATION:  Education details: See above Person educated: Patient Education method: Explanation, Demonstration, Tactile cues, Verbal cues, and Handouts Education comprehension: verbalized understanding, returned demonstration, verbal cues required, tactile cues required, and needs further education  HOME EXERCISE PROGRAM: Access Code: [email or web address] URL: [email or web address] Date: 10/08/2022 Prepared by: [name]  Exercises - Supine Quadricep Sets  - 3 x daily - 7 x weekly - 3 sets - 10 reps - 5 second hold - Small Range Straight Leg Raise  - 3 x daily - 7 x weekly - 3-5 sets - 5 reps - 3 seconds hold - Single Leg Knee Extension with Weight Machine  - 1 x daily - 7 x weekly - 3 sets - 10 reps - Lateral Step Down  - 1-2 x daily  - 7 x weekly - 3 sets - 10 reps - Lunge Matrix on Slider  - 1-2 x daily - 7 x weekly - 2 sets - 5 reps  ASSESSMENT:  CLINICAL IMPRESSION: Continued use of  BFR with exercises today which he tolerated well.  Will continue to benefit from PT to maximize function.  OBJECTIVE IMPAIRMENTS: decreased activity tolerance, decreased endurance, decreased knowledge of condition, decreased strength, increased edema, obesity, and pain.   ACTIVITY LIMITATIONS: squatting, stairs, locomotion level, and high level workouts  PARTICIPATION LIMITATIONS: community activity and workouts  PERSONAL FACTORS:  SVT and obesity  are also affecting patient's functional outcome.   REHAB POTENTIAL: Good  CLINICAL DECISION MAKING: Stable/uncomplicated  EVALUATION COMPLEXITY: Low   GOALS: Goals reviewed with patient? Yes  SHORT TERM GOALS: Target date: 10/25/2022 Rocke will be independent with his day 1 home exercise program Baseline: Started 09/27/2022 Goal status: MET 10/08/22  2.  Kapil will be independent with his home gym exercise program Baseline: Briefly discussed visit 1 and will be expanded on future visits Goal status: INITIAL  3.  Reduce left thigh atrophy as assessed 15 cm proximal to the superior patellar pole to 2 cm or less Baseline: 3 cm Goal status: INITIAL   LONG TERM GOALS: Target date: 11/22/2022  Jacky will score 63 or better on FOTO in 11 visits or less Baseline: 54 Goal status: INITIAL  2.  Tandre will report left knee pain consistently less than 2 out of 10 on the numeric pain rating scale Baseline: 3 out of 10 Goal status: INITIAL  3.  Zakyrie will have less than 1 cm of thigh atrophy as assessed 15 cm proximal to the superior patellar pole left versus right Baseline: 3 cm of atrophy Goal status: INITIAL  4.  Gery Pray will be independent with his long-term home exercise program at discharge Baseline: Started 09/27/2022 Goal status: INITIAL   PLAN:  PT FREQUENCY: 1-2x/week  PT  DURATION: 8 weeks  PLANNED INTERVENTIONS: Therapeutic exercises, Therapeutic activity, Neuromuscular re-education, Balance training, Gait training, Patient/Family education, Self Care, Stair training, Electrical stimulation, Cryotherapy, and Vasopneumatic device  PLAN FOR NEXT SESSION: give hip 4 way supine for HEP, Continue blood flow restriction with basic quadriceps strengthening activities (3 cm atrophy left thigh vs right)    Clarita Crane, PT, DPT 10/16/22 8:44 AM

## 2022-10-19 ENCOUNTER — Ambulatory Visit: Payer: Managed Care, Other (non HMO) | Admitting: Rehabilitative and Restorative Service Providers"

## 2022-10-19 ENCOUNTER — Encounter: Payer: Self-pay | Admitting: Rehabilitative and Restorative Service Providers"

## 2022-10-19 DIAGNOSIS — G8929 Other chronic pain: Secondary | ICD-10-CM | POA: Diagnosis not present

## 2022-10-19 DIAGNOSIS — M6281 Muscle weakness (generalized): Secondary | ICD-10-CM | POA: Diagnosis not present

## 2022-10-19 DIAGNOSIS — M25562 Pain in left knee: Secondary | ICD-10-CM

## 2022-10-19 NOTE — Therapy (Signed)
OUTPATIENT PHYSICAL THERAPY TREATMENT   Patient Name: Benjamin Norman MRN: [id number] DOB:[birth year], 47 y.o., male Today's Date: 10/19/2022  END OF SESSION:  PT End of Session - 10/19/22 0848     Visit Number 5    Number of Visits 12    Date for PT Re-Evaluation 11/22/22    Authorization - Number of Visits 5    Progress Note Due on Visit 12    PT Start Time 0848    PT Stop Time 0931    PT Time Calculation (min) 43 min    Activity Tolerance Patient tolerated treatment well;No increased pain    Behavior During Therapy [facility] for tasks assessed/performed              Past Medical History:  Diagnosis Date   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    intermittent   Childhood asthma    COVID-19 09/02/2019   COVID-19 virus infection 08/2019   Fever blister    Heart murmur    as child   Other kyphosis (acquired)    thoracic   SVT (supraventricular tachycardia)    AV re entry Benjamin Norman)   Trochlear nerve palsy, 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Benjamin Norman)   Unspecified tinnitus    Past Surgical History:  Procedure Laterality Date   MOHS SURGERY     Patient Active Problem List   Diagnosis Date Noted   Hyperglycemia 09/17/2022   Left anterior knee pain 12/04/2021   Pulmonary granuloma (HCC) 09/14/2019   SVT (supraventricular tachycardia) 08/19/2019   Obesity, Class I, BMI 30-34.9 08/19/2019   Trochlear nerve palsy, right eye    Healthcare maintenance 02/09/2011   NEVUS, ATYPICAL 02/02/2010   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  He notes strength progress, although the right remains significantly more strong and stable than the left knee.  PERTINENT HISTORY: SVT, obesity  PAIN:  Are you having pain? Yes: NPRS scale: 0-4/10 Pain location: Infrapatellar left knee Pain description: Swelling Aggravating factors: Too much WB, jogging Relieving factors: Ice  PRECAUTIONS: None  WEIGHT BEARING RESTRICTIONS: No  FALLS:  Has patient fallen in last 6 months? No  LIVING ENVIRONMENT: Lives with: lives with their family and lives with their spouse Lives in: House/apartment Stairs:  Stairs are a problem going up and down Has following equipment at home: None  OCCUPATION: On a computer most of the day  PLOF: Independent  PATIENT GOALS: Get back to home gym (bike, treadmill, elliptical, free weights)  NEXT MD VISIT: ?  OBJECTIVE:   DIAGNOSTIC FINDINGS: Benjamin Norman notes he has a "loose body" in his left knee  PATIENT SURVEYS:  FOTO 54 (Goal 63 in 11 visits)  COGNITION: Overall cognitive status: Within functional limits for tasks assessed     SENSATION: No complaints of peripheral pain or paresthesias  MUSCLE LENGTH: Hamstrings: Right 35 deg; Left 35 deg Quadriceps: Right 120 deg; Left 120 deg   LOWER EXTREMITY ROM:  Active ROM Right eval Left eval  Knee flexion 147  144  Knee extension 0 0   (Blank rows = not tested)  LOWER EXTREMITY MMT:  MMT Right eval Left eval  Knee extension 54 cm 51 cm   (Blank rows = not tested)  GAIT: Distance walked: In the office Assistive device utilized: None Level of assistance: Complete Independence Comments: Benjamin Norman would like to return to running and doing 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 races  BFR Pressure measures:   Exercise at 80%: Long sitting:  200 mm Hg  160 mm Hg Supine:   170 mm Hg  136 mm Hg Standing:  260 mm Hg  208 mm Hg  Cuff size 4  TODAY'S TREATMENT:                                                                                                                               DATE:  10/19/2022 Recumbent bike Seat 10 Level 5-8 for 5 minutes Quadriceps sets supine with BFR at 136 mm Hg (136 mm Hg) 30/15/15/15 Seated straight leg raises with 2# 3 sets of 10 for 3 seconds, slow eccentrics, pause before lift Side lie hip abduction 10X 3 seconds Alternating hip hike at counter top 2 sets of 10 3 seconds  Functional Activities: Single Leg Press with 37# 30/15/15/15 reps (4 sets) with 60 seconds rest between sets of 30/15/15/15 reps 160 mm Hg   10/16/22 TherEx Recumbent bike L5 x 8 min The following exercises with BFR with 30 sec rest between sets (see above for details) Seated SLR 3x20  Long sitting quad sets with ball squeeze 3x20; 5 sec hold Sidelying SLR 3x20   10/11/2022 Set up BFR pressures  Quadriceps sets supine with BFR at 136 mm Hg (80% of 170 mm Hg) 20X 5 seconds Seated straight leg raises with 1# 3 sets of 5 for 3 seconds, slow eccentrics, pause before lift  Functional Activities: Double Leg Press with 62# 30/15/15/15 reps (4 sets) with 60 seconds rest between sets of 30/15/15/15 reps    PATIENT EDUCATION:  Education details: See above Person educated: Patient Education method: Explanation, Demonstration, Tactile cues, Verbal cues, and Handouts Education comprehension: verbalized understanding, returned demonstration, verbal cues required, tactile cues required, and needs further education  HOME EXERCISE PROGRAM: Access Code: [email or web address] URL: [email or web address] Date: 10/08/2022 Prepared by: Benjamin Norman  Exercises - Supine Quadricep Sets  - 3 x daily - 7 x weekly - 3 sets - 10 reps - 5 second hold - Small Range Straight Leg Raise  - 3 x daily - 7 x weekly - 3-5 sets - 5 reps - 3 seconds hold - Single Leg Knee Extension with Weight Machine  - 1 x daily - 7 x weekly - 3 sets - 10 reps - Lateral Step Down  - 1-2 x daily - 7 x weekly - 3 sets - 10 reps -  Lunge Matrix on Slider  - 1-2 x daily - 7 x weekly - 2 sets - 5 reps  ASSESSMENT:  CLINICAL  IMPRESSION: Benjamin Norman notes less knee popping and better strength since starting PT.  His left quadriceps still fatigue quickly and his left lower extremity is still significantly weaker than his right.  We discussed doing a progress note at the end of the month to determine progress and consider 1 vs 2x/week PT attendance.  OBJECTIVE IMPAIRMENTS: decreased activity tolerance, decreased endurance, decreased knowledge of condition, decreased strength, increased edema, obesity, and pain.   ACTIVITY LIMITATIONS: squatting, stairs, locomotion level, and high level workouts  PARTICIPATION LIMITATIONS: community activity and workouts  PERSONAL FACTORS:  SVT and obesity  are also affecting patient's functional outcome.   REHAB POTENTIAL: Good  CLINICAL DECISION MAKING: Stable/uncomplicated  EVALUATION COMPLEXITY: Low   GOALS: Goals reviewed with patient? Yes  SHORT TERM GOALS: Target date: 10/25/2022 Ryheem will be independent with his day 1 home exercise program Baseline: Started 09/27/2022 Goal status: MET 10/08/22  2.  Rayvaughn will be independent with his home gym exercise program Baseline: Briefly discussed visit 1 and will be expanded on future visits Goal status: INITIAL  3.  Reduce left thigh atrophy as assessed 15 cm proximal to the superior patellar pole to 2 cm or less Baseline: 3 cm Goal status: INITIAL   LONG TERM GOALS: Target date: 11/22/2022  Otha will score 63 or better on FOTO in 11 visits or less Baseline: 54 Goal status: INITIAL  2.  Petra will report left knee pain consistently less than 2 out of 10 on the numeric pain rating scale Baseline: 3 out of 10 Goal status: INITIAL  3.  Andyn will have less than 1 cm of thigh atrophy as assessed 15 cm proximal to the superior patellar pole left versus right Baseline: 3 cm of atrophy Goal status: INITIAL  4.  Gery Pray will be independent  with his long-term home exercise program at discharge Baseline: Started 09/27/2022 Goal status: INITIAL   PLAN:  PT FREQUENCY: 1-2x/week  PT DURATION: 8 weeks  PLANNED INTERVENTIONS: Therapeutic exercises, Therapeutic activity, Neuromuscular re-education, Balance training, Gait training, Patient/Family education, Self Care, Stair training, Electrical stimulation, Cryotherapy, and Vasopneumatic device  PLAN FOR NEXT SESSION: Continue blood flow restriction with basic quadriceps strengthening activities (3 cm atrophy left thigh vs right).  Reassess late May, 1st of June to determine 1 vs 2x/week PT attendance.    Cherlyn Cushing PT, MPT 10/19/22 11:18 AM

## 2022-10-22 ENCOUNTER — Ambulatory Visit: Payer: Managed Care, Other (non HMO) | Admitting: Physical Therapy

## 2022-10-22 ENCOUNTER — Encounter: Payer: Self-pay | Admitting: Physical Therapy

## 2022-10-22 DIAGNOSIS — M6281 Muscle weakness (generalized): Secondary | ICD-10-CM | POA: Diagnosis not present

## 2022-10-22 DIAGNOSIS — M25562 Pain in left knee: Secondary | ICD-10-CM

## 2022-10-22 DIAGNOSIS — G8929 Other chronic pain: Secondary | ICD-10-CM

## 2022-10-22 NOTE — Therapy (Signed)
OUTPATIENT PHYSICAL THERAPY TREATMENT   Patient Name: Benjamin Norman MRN: [id number] DOB:22-Jan-1976, 47 y.o., male Today's Date: 10/22/2022  END OF SESSION:  PT End of Session - 10/22/22 0839     Visit Number 6    Number of Visits 12    Date for PT Re-Evaluation 11/22/22    Authorization - Number of Visits 6    Progress Note Due on Visit 12    PT Start Time 0800    PT Stop Time 0845    PT Time Calculation (min) 45 min    Activity Tolerance Patient tolerated treatment well;No increased pain    Behavior During Therapy [facility] for tasks assessed/performed               Past Medical History:  Diagnosis Date   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    intermittent   Childhood asthma    COVID-19 09/02/2019   COVID-19 virus infection 08/2019   Fever blister    Heart murmur    as child   Other kyphosis (acquired)    thoracic   SVT (supraventricular tachycardia)    AV re entry [name]   Trochlear nerve palsy, 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[name])   Unspecified tinnitus    Past Surgical History:  Procedure Laterality Date   MOHS SURGERY     Patient Active Problem List   Diagnosis Date Noted   Hyperglycemia 09/17/2022   Left anterior knee pain 12/04/2021   Pulmonary granuloma (HCC) 09/14/2019   SVT (supraventricular tachycardia) 08/19/2019   Obesity, Class I, BMI 30-34.9 08/19/2019   Trochlear nerve palsy, right eye    Healthcare maintenance 02/09/2011   NEVUS, ATYPICAL 02/02/2010   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 Yes: NPRS scale: 0-4/10 Pain location: Infrapatellar left knee Pain description: Swelling Aggravating factors: Too much WB, jogging Relieving factors: Ice  PRECAUTIONS: None  WEIGHT BEARING RESTRICTIONS: No  FALLS:  Has patient fallen in last 6 months? No  LIVING ENVIRONMENT: Lives with: lives with their family and lives with their spouse Lives in: House/apartment Stairs:  Stairs are a problem going up and down Has following equipment at home: None  OCCUPATION: On a computer most of the day  PLOF: Independent  PATIENT GOALS: Get back to home gym (bike, treadmill, elliptical, free weights)  NEXT MD VISIT: ?  OBJECTIVE:   DIAGNOSTIC FINDINGS: Myers notes he has a "loose body" in his left knee  PATIENT SURVEYS:  FOTO 54 (Goal 63 in 11 visits)  COGNITION: Overall cognitive status: Within functional limits for tasks assessed     SENSATION: No complaints of peripheral pain or paresthesias  MUSCLE LENGTH: Hamstrings: Right 35 deg; Left 35 deg Quadriceps: Right 120 deg; Left 120 deg   LOWER EXTREMITY ROM:  Active ROM Right eval Left eval  Knee flexion 147 144  Knee extension 0 0   (Blank rows = not tested)  LOWER EXTREMITY MMT:  MMT Right eval Left eval  Knee extension 54 cm 51 cm   (  Blank rows = not tested)  GAIT: Distance walked: In the office Assistive device utilized: None Level of assistance: Complete Independence Comments: Taquon would like to return to running and doing 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 races  BFR Pressure measures:   Exercise at 80%: Long sitting:  200 mm Hg  160 mm Hg Supine:   170 mm Hg  136 mm Hg Standing:  260 mm Hg  208 mm Hg  Cuff size 4  TODAY'S TREATMENT:                                                                                                                              DATE:  10/22/22 TherEx Recumbent bike Seat 8 Level 8 for 8 minutes The following exercises  with BFR with 30 sec rest between sets (see above for details) Leg press LLE only 43# x 13 reps then 37# x17; 3x15 (min A PRN from RLE) Sidelying hip abduction circles x 20 reps CW/CCW; 3 sets Sidelying hip adduction 3x20 reps on Lt Seated SLR 3x20 on Lt  10/19/2022 Recumbent bike Seat 10 Level 5-8 for 5 minutes Quadriceps sets supine with BFR at 136 mm Hg (136 mm Hg) 30/15/15/15 Seated straight leg raises with 2# 3 sets of 10 for 3 seconds, slow eccentrics, pause before lift Side lie hip abduction 10X 3 seconds Alternating hip hike at counter top 2 sets of 10 3 seconds  Functional Activities: Single Leg Press with 37# 30/15/15/15 reps (4 sets) with 60 seconds rest between sets of 30/15/15/15 reps 160 mm Hg   10/16/22 TherEx Recumbent bike L5 x 8 min The following exercises with BFR with 30 sec rest between sets (see above for details) Seated SLR 3x20  Long sitting quad sets with ball squeeze 3x20; 5 sec hold Sidelying SLR 3x20   10/11/2022 Set up BFR pressures  Quadriceps sets supine with BFR at 136 mm Hg (80% of 170 mm Hg) 20X 5 seconds Seated straight leg raises with 1# 3 sets of 5 for 3 seconds, slow eccentrics, pause before lift  Functional Activities: Double Leg Press with 62# 30/15/15/15 reps (4 sets) with 60 seconds rest between sets of 30/15/15/15 reps    PATIENT EDUCATION:  Education details: See above Person educated: Patient Education method: Explanation, Demonstration, Tactile cues, Verbal cues, and Handouts Education comprehension: verbalized understanding, returned demonstration, verbal cues required, tactile cues required, and needs further education  HOME EXERCISE PROGRAM: Access Code: [email or web address] URL: [email or web address] Date: 10/08/2022 Prepared by: [name]  Exercises - Supine Quadricep Sets  - 3 x daily - 7 x weekly - 3 sets - 10 reps - 5 second hold - Small Range Straight Leg Raise  - 3 x daily - 7 x weekly - 3-5 sets - 5  reps - 3 seconds hold - Single Leg Knee Extension with Weight Machine  - 1 x daily - 7 x weekly - 3 sets - 10 reps - Lateral Step Down  - 1-2 x daily -  7 x weekly - 3 sets - 10 reps - Lunge Matrix on Slider  - 1-2 x daily - 7 x weekly - 2 sets - 5 reps  ASSESSMENT:  CLINICAL IMPRESSION: Pt tolerated session well today with continued focus on strengthening utilizing BFR.  Will continue to benefit from PT to maximize function.  OBJECTIVE IMPAIRMENTS: decreased activity tolerance, decreased endurance, decreased knowledge of condition, decreased strength, increased edema, obesity, and pain.   ACTIVITY LIMITATIONS: squatting, stairs, locomotion level, and high level workouts  PARTICIPATION LIMITATIONS: community activity and workouts  PERSONAL FACTORS:  SVT and obesity  are also affecting patient's functional outcome.   REHAB POTENTIAL: Good  CLINICAL DECISION MAKING: Stable/uncomplicated  EVALUATION COMPLEXITY: Low   GOALS: Goals reviewed with patient? Yes  SHORT TERM GOALS: Target date: 10/25/2022 Jonel will be independent with his day 1 home exercise program Baseline: Started 09/27/2022 Goal status: MET 10/08/22  2.  Queshawn will be independent with his home gym exercise program Baseline: Briefly discussed visit 1 and will be expanded on future visits Goal status: INITIAL  3.  Reduce left thigh atrophy as assessed 15 cm proximal to the superior patellar pole to 2 cm or less Baseline: 3 cm Goal status: INITIAL   LONG TERM GOALS: Target date: 11/22/2022  Jionny will score 63 or better on FOTO in 11 visits or less Baseline: 54 Goal status: INITIAL  2.  Jentry will report left knee pain consistently less than 2 out of 10 on the numeric pain rating scale Baseline: 3 out of 10 Goal status: INITIAL  3.  Devvon will have less than 1 cm of thigh atrophy as assessed 15 cm proximal to the superior patellar pole left versus right Baseline: 3 cm of atrophy Goal status: INITIAL  4.  Gery Pray  will be independent with his long-term home exercise program at discharge Baseline: Started 09/27/2022 Goal status: INITIAL   PLAN:  PT FREQUENCY: 1-2x/week  PT DURATION: 8 weeks  PLANNED INTERVENTIONS: Therapeutic exercises, Therapeutic activity, Neuromuscular re-education, Balance training, Gait training, Patient/Family education, Self Care, Stair training, Electrical stimulation, Cryotherapy, and Vasopneumatic device  PLAN FOR NEXT SESSION: trial closed chain again;  Continue blood flow restriction with basic quadriceps strengthening activities (3 cm atrophy left thigh vs right).  Reassess late May, 1st of June to determine 1 vs 2x/week PT attendance.    Clarita Crane, PT, DPT 10/22/22 8:47 AM

## 2022-10-24 ENCOUNTER — Encounter: Payer: Self-pay | Admitting: Physical Therapy

## 2022-10-24 ENCOUNTER — Ambulatory Visit: Payer: Managed Care, Other (non HMO) | Admitting: Physical Therapy

## 2022-10-24 DIAGNOSIS — M25562 Pain in left knee: Secondary | ICD-10-CM | POA: Diagnosis not present

## 2022-10-24 DIAGNOSIS — G8929 Other chronic pain: Secondary | ICD-10-CM

## 2022-10-24 DIAGNOSIS — M6281 Muscle weakness (generalized): Secondary | ICD-10-CM | POA: Diagnosis not present

## 2022-10-24 NOTE — Therapy (Signed)
OUTPATIENT PHYSICAL THERAPY TREATMENT   Patient Name: Benjamin Norman MRN: [id number] DOB:Jan 11, 1976, 47 y.o., male Today's Date: 10/24/2022  END OF SESSION:  PT End of Session - 10/24/22 0800     Visit Number 7    Number of Visits 12    Date for PT Re-Evaluation 11/22/22    Authorization - Number of Visits 7    Progress Note Due on Visit 12    PT Start Time 0801    PT Stop Time 0846    PT Time Calculation (min) 45 min    Activity Tolerance Patient tolerated treatment well;No increased pain    Behavior During Therapy [facility] for tasks assessed/performed                Past Medical History:  Diagnosis Date   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    intermittent   Childhood asthma    COVID-19 09/02/2019   COVID-19 virus infection 08/2019   Fever blister    Heart murmur    as child   Other kyphosis (acquired)    thoracic   SVT (supraventricular tachycardia)    AV re entry [name]   Trochlear nerve palsy, 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[name])   Unspecified tinnitus    Past Surgical History:  Procedure Laterality Date   MOHS SURGERY     Patient Active Problem List   Diagnosis Date Noted   Hyperglycemia 09/17/2022   Left anterior knee pain 12/04/2021   Pulmonary granuloma (HCC) 09/14/2019   SVT (supraventricular tachycardia) 08/19/2019   Obesity, Class I, BMI 30-34.9 08/19/2019   Trochlear nerve palsy, right eye    Healthcare maintenance 02/09/2011   NEVUS, ATYPICAL 02/02/2010   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 Yes: NPRS scale: 0-4/10 Pain location: Infrapatellar left knee Pain description: Swelling Aggravating factors: Too much WB, jogging Relieving factors: Ice  PRECAUTIONS: None  WEIGHT BEARING RESTRICTIONS: No  FALLS:  Has patient fallen in last 6 months? No  LIVING ENVIRONMENT: Lives with: lives with their family and lives with their spouse Lives in: House/apartment Stairs:  Stairs are a problem going up and down Has following equipment at home: None  OCCUPATION: On a computer most of the day  PLOF: Independent  PATIENT GOALS: Get back to home gym (bike, treadmill, elliptical, free weights)  NEXT MD VISIT: ?  OBJECTIVE:   DIAGNOSTIC FINDINGS: Ulysses notes he has a "loose body" in his left knee  PATIENT SURVEYS:  FOTO 54 (Goal 63 in 11 visits)  COGNITION: Overall cognitive status: Within functional limits for tasks assessed     SENSATION: No complaints of peripheral pain or paresthesias  MUSCLE LENGTH: Hamstrings: Right 35 deg; Left 35 deg Quadriceps: Right 120 deg; Left 120 deg   LOWER EXTREMITY ROM:  Active ROM Right eval Left eval  Knee flexion 147 144  Knee extension 0 0   (Blank rows = not tested)  LOWER EXTREMITY MMT: 15 cm proximal to superior patellar pole  MMT Right  eval Left eval Left 10/24/22  Knee extension 54 cm 51 cm 51 cm        (Blank rows = not tested)  GAIT: Distance walked: In the office Assistive device utilized: None Level of assistance: Complete Independence Comments: Theophil would like to return to running and doing 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 races  BFR Pressure measures:   Exercise at 80%: Long sitting:  200 mm Hg  160 mm Hg Supine:   170 mm Hg  136 mm Hg Standing:  260 mm Hg  208 mm Hg  Cuff size 4  TODAY'S TREATMENT:                                                                                                                              DATE:   10/24/22 TherEx Recumbent bike Seat 8 Level 8 for 8 minutes The following exercises with BFR with 30 sec rest between sets (see above for details) Leg press LLE only 50# x 30 reps; 3x15 (needing RLE for concentric) Sidelying hip circles 3 sets of 20 circles each direction Sidelying hip adduction pulse 3x20  Seated SLR 3x20 reps  10/22/22 TherEx Recumbent bike Seat 8 Level 8 for 8 minutes The following exercises with BFR with 30 sec rest between sets (see above for details) Leg press LLE only 43# x 13 reps then 37# x17; 3x15 (min A PRN from RLE) Sidelying hip abduction circles x 20 reps CW/CCW; 3 sets Sidelying hip adduction 3x20 reps on Lt Seated SLR 3x20 on Lt  10/19/2022 Recumbent bike Seat 10 Level 5-8 for 5 minutes Quadriceps sets supine with BFR at 136 mm Hg (136 mm Hg) 30/15/15/15 Seated straight leg raises with 2# 3 sets of 10 for 3 seconds, slow eccentrics, pause before lift Side lie hip abduction 10X 3 seconds Alternating hip hike at counter top 2 sets of 10 3 seconds  Functional Activities: Single Leg Press with 37# 30/15/15/15 reps (4 sets) with 60 seconds rest between sets of 30/15/15/15 reps 160 mm Hg  PATIENT EDUCATION:  Education details: See above Person educated: Patient Education method: Explanation, Demonstration, Tactile cues, Verbal cues, and Handouts Education comprehension: verbalized understanding, returned demonstration, verbal cues required, tactile cues required, and needs further education  HOME EXERCISE PROGRAM: Access Code: [email or web address] URL: [email or web address] Date: 10/24/2022 Prepared by: [name]  Exercises - Supine Quadricep Sets  - 3 x daily - 7 x weekly - 3 sets - 10 reps - 5 second hold - Small Range Straight Leg Raise  - 3 x daily - 7 x weekly - 3-5 sets - 5 reps - 3 seconds hold - Single Leg Knee Extension with Weight Machine  - 1 x daily - 7 x weekly - 3 sets - 10 reps - Lateral Step Down  - 1-2 x daily - 7 x  weekly - 3 sets - 10 reps - Lunge Matrix on Slider  - 1-2 x daily - 7 x weekly - 2 sets -  5 reps - Standing Hip Hiking  - 3-5 x daily - 7 x weekly - 1 sets - 10 reps - 3 seconds hold - Sidelying Hip Circles  - 1-2 x daily - 7 x weekly - 1-2 sets - 10 reps - Sidelying Hip Adduction  - 1-2 x daily - 7 x weekly - 1-2 sets - 10 reps - Seated Straight Leg Raise  - 1 x daily - 7 x weekly - 3 sets - 10 reps  ASSESSMENT:  CLINICAL IMPRESSION: Pt without significant change in thigh girth today but continues to report less popping and is not longer dependent on brace.  He has met 2 STGs to date.  Will continue to benefit from PT to maximize function.  OBJECTIVE IMPAIRMENTS: decreased activity tolerance, decreased endurance, decreased knowledge of condition, decreased strength, increased edema, obesity, and pain.   ACTIVITY LIMITATIONS: squatting, stairs, locomotion level, and high level workouts  PARTICIPATION LIMITATIONS: community activity and workouts  PERSONAL FACTORS:  SVT and obesity  are also affecting patient's functional outcome.   REHAB POTENTIAL: Good  CLINICAL DECISION MAKING: Stable/uncomplicated  EVALUATION COMPLEXITY: Low   GOALS: Goals reviewed with patient? Yes  SHORT TERM GOALS: Target date: 10/25/2022 Roche will be independent with his day 1 home exercise program Baseline: Started 09/27/2022 Goal status: MET 10/08/22  2.  Taniela will be independent with his home gym exercise program Baseline: Briefly discussed visit 1 and will be expanded on future visits Goal status: MET 10/24/22  3.  Reduce left thigh atrophy as assessed 15 cm proximal to the superior patellar pole to 2 cm or less Baseline: 3 cm Goal status: IN PROGRESS 10/24/22   LONG TERM GOALS: Target date: 11/22/2022  Noach will score 63 or better on FOTO in 11 visits or less Baseline: 54 Goal status: INITIAL  2.  Zael will report left knee pain consistently less than 2 out of 10 on the numeric pain rating  scale Baseline: 3 out of 10 Goal status: INITIAL  3.  Bentura will have less than 1 cm of thigh atrophy as assessed 15 cm proximal to the superior patellar pole left versus right Baseline: 3 cm of atrophy Goal status: INITIAL  4.  Gery Pray will be independent with his long-term home exercise program at discharge Baseline: Started 09/27/2022 Goal status: INITIAL   PLAN:  PT FREQUENCY: 1-2x/week  PT DURATION: 8 weeks  PLANNED INTERVENTIONS: Therapeutic exercises, Therapeutic activity, Neuromuscular re-education, Balance training, Gait training, Patient/Family education, Self Care, Stair training, Electrical stimulation, Cryotherapy, and Vasopneumatic device  PLAN FOR NEXT SESSION: review updated HEP, how is HEP going?,  Continue blood flow restriction with basic quadriceps strengthening activities (3 cm atrophy left thigh vs right).  Reassess late May, 1st of June to determine 1 vs 2x/week PT attendance.    Clarita Crane, PT, DPT 10/24/22 8:50 AM

## 2022-10-26 ENCOUNTER — Telehealth: Payer: Self-pay | Admitting: Family Medicine

## 2022-10-26 NOTE — Telephone Encounter (Signed)
Talked with patient's wife.  Sx resolved, pulse 70s, after vagal maneuver.  He clearly feels better.  Appears not to need ER eval at this point but routed to PCP for any extra input and FYI.

## 2022-10-26 NOTE — Telephone Encounter (Signed)
Call from Pt's wife.  H/o tachycardia, with current episode, started today.  He is going to try vagal maneuver but if not better will see care.  ER cautions d/w her- he seek eval if not clearly better.  He is currently in [facility].    Routed to PCP as Lorain Childes

## 2022-10-30 ENCOUNTER — Encounter: Payer: Managed Care, Other (non HMO) | Admitting: Rehabilitative and Restorative Service Providers"

## 2022-10-30 NOTE — Telephone Encounter (Signed)
Spoke with pt relaying Dr. Timoteo Expose message. Pt verbalizes understanding and declines PRN beta blocker at this time stating episodes are so infrequent but will contact cardiology if they become more frequent.

## 2022-10-30 NOTE — Telephone Encounter (Signed)
Patient returned cal to the office, would like a call back whenever possible. Please advise, thank you.

## 2022-10-30 NOTE — Telephone Encounter (Signed)
Agree with vagal maneuvers PRN.  Has h/o SVT previously evaluated by cardiology 2017. At that time had suggested PRN beta blocker to have when episodes occur.  To let us know if he'd like to try this vs could return to cardiology if becoming more frequent events.

## 2022-10-30 NOTE — Telephone Encounter (Signed)
Lvm asking pt to call back. Need to relay Dr. Timoteo Expose message and see if pt decided if he wants to try medication or may need to return to cardiology if episodes are becoming more frequent.

## 2022-11-01 ENCOUNTER — Ambulatory Visit: Payer: Managed Care, Other (non HMO) | Admitting: Physical Therapy

## 2022-11-01 ENCOUNTER — Encounter: Payer: Self-pay | Admitting: Physical Therapy

## 2022-11-01 DIAGNOSIS — M6281 Muscle weakness (generalized): Secondary | ICD-10-CM | POA: Diagnosis not present

## 2022-11-01 DIAGNOSIS — M25562 Pain in left knee: Secondary | ICD-10-CM

## 2022-11-01 DIAGNOSIS — G8929 Other chronic pain: Secondary | ICD-10-CM | POA: Diagnosis not present

## 2022-11-01 NOTE — Therapy (Signed)
OUTPATIENT PHYSICAL THERAPY TREATMENT   Patient Name: Benjamin Norman MRN: [id number] DOB:1976-03-24, 47 y.o., male Today's Date: 11/01/2022  END OF SESSION:  PT End of Session - 11/01/22 0805     Visit Number 8    Number of Visits 12    Date for PT Re-Evaluation 11/22/22    Authorization - Number of Visits 8    PT Start Time 0802    PT Stop Time 0842    PT Time Calculation (min) 40 min    Activity Tolerance Patient tolerated treatment well;No increased pain    Behavior During Therapy [name] for tasks assessed/performed                 Past Medical History:  Diagnosis Date   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    intermittent   Childhood asthma    COVID-19 09/02/2019   COVID-19 virus infection 08/2019   Fever blister    Heart murmur    as child   Other kyphosis (acquired)    thoracic   SVT (supraventricular tachycardia)    AV re entry Benjamin Norman)   Trochlear nerve palsy, 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Benjamin Norman)   Unspecified tinnitus    Past Surgical History:  Procedure Laterality Date   MOHS SURGERY     Patient Active Problem List   Diagnosis Date Noted   Hyperglycemia 09/17/2022   Left anterior knee pain 12/04/2021   Pulmonary granuloma (HCC) 09/14/2019   SVT (supraventricular tachycardia) 08/19/2019   Obesity, Class I, BMI 30-34.9 08/19/2019   Trochlear nerve palsy, right eye    Healthcare maintenance 02/09/2011   NEVUS, ATYPICAL 02/02/2010   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 Yes: NPRS scale: 0-4/10 Pain location: Infrapatellar left knee Pain description: Swelling Aggravating factors: Too much WB, jogging Relieving factors: Ice  PRECAUTIONS: None  WEIGHT BEARING RESTRICTIONS: No  FALLS:  Has patient fallen in last 6 months? No  LIVING ENVIRONMENT: Lives with: lives with their family and lives with their spouse Lives in: House/apartment Stairs:  Stairs are a problem going up and down Has following equipment at home: None  OCCUPATION: On a computer most of the day  PLOF: Independent  PATIENT GOALS: Get back to home gym (bike, treadmill, elliptical, free weights)  NEXT MD VISIT: ?  OBJECTIVE:   DIAGNOSTIC FINDINGS: Benjamin Norman notes he has a "loose body" in his left knee  PATIENT SURVEYS:  FOTO 54 (Goal 63 in 11 visits)  COGNITION: Overall cognitive status: Within functional limits for tasks assessed     SENSATION: No complaints of peripheral pain or paresthesias  MUSCLE LENGTH: Hamstrings: Right 35 deg; Left 35 deg Quadriceps: Right 120 deg; Left 120 deg   LOWER EXTREMITY ROM:  Active ROM Right eval Left eval  Knee flexion 147 144  Knee extension 0 0   (Blank rows = not tested)  LOWER EXTREMITY MMT: 15 cm proximal to superior patellar pole  MMT Right eval Left  eval Left 10/24/22  Knee extension 54 cm 51 cm 51 cm        (Blank rows = not tested)  GAIT: Distance walked: In the office Assistive device utilized: None Level of assistance: Complete Independence Comments: Benjamin Norman would like to return to running and doing 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 races  BFR Pressure measures:   Exercise at 80%: Long sitting:  200 mm Hg  160 mm Hg Supine:   170 mm Hg  136 mm Hg Standing:  260 mm Hg  208 mm Hg  Cuff size 4  TODAY'S TREATMENT:                                                                                                                              DATE:   11/01/22 TherEx Recumbent bike Seat 8 Level 8 for 8 minutes The following exercises with BFR with 30 sec rest between sets (see above for details) Leg press LLE only 50# x 30 reps; 3x15 (needing RLE for concentric) LLE on 2" step with RLE heel taps x30 reps; 3x15 Seated SLR x30 reps;  3x15 reps  10/24/22 TherEx Recumbent bike Seat 8 Level 8 for 8 minutes The following exercises with BFR with 30 sec rest between sets (see above for details) Leg press LLE only 50# x 30 reps; 3x15 (needing RLE for concentric) Sidelying hip circles 3 sets of 20 circles each direction Sidelying hip adduction pulse 3x20  Seated SLR 3x20 reps  10/22/22 TherEx Recumbent bike Seat 8 Level 8 for 8 minutes The following exercises with BFR with 30 sec rest between sets (see above for details) Leg press LLE only 43# x 13 reps then 37# x17; 3x15 (min A PRN from RLE) Sidelying hip abduction circles x 20 reps CW/CCW; 3 sets Sidelying hip adduction 3x20 reps on Lt Seated SLR 3x20 on Lt  10/19/2022 Recumbent bike Seat 10 Level 5-8 for 5 minutes Quadriceps sets supine with BFR at 136 mm Hg (136 mm Hg) 30/15/15/15 Seated straight leg raises with 2# 3 sets of 10 for 3 seconds, slow eccentrics, pause before lift Side lie hip abduction 10X 3 seconds Alternating hip hike at counter top 2 sets of 10 3 seconds  Functional Activities: Single Leg Press with 37# 30/15/15/15 reps (4 sets) with 60 seconds rest between sets of 30/15/15/15 reps 160 mm Hg  PATIENT EDUCATION:  Education details: See above Person educated: Patient Education method: Explanation, Demonstration, Tactile cues, Verbal cues, and Handouts Education comprehension: verbalized understanding, returned demonstration, verbal cues required, tactile cues required, and needs further education  HOME EXERCISE PROGRAM: Access Code: [email or web address] URL: [email or web address] Date: 10/24/2022 Prepared by: Benjamin Norman  Exercises - Supine  Quadricep Sets  - 3 x daily - 7 x weekly - 3 sets - 10 reps - 5 second hold - Small Range Straight Leg Raise  - 3 x daily - 7 x weekly - 3-5 sets - 5 reps - 3 seconds hold - Single Leg Knee  Extension with Weight Machine  - 1 x daily - 7 x weekly - 3 sets - 10 reps - Lateral Step Down  - 1-2 x daily - 7 x weekly - 3 sets - 10 reps - Lunge Matrix on Slider  - 1-2 x daily - 7 x weekly - 2 sets - 5 reps - Standing Hip Hiking  - 3-5 x daily - 7 x weekly - 1 sets - 10 reps - 3 seconds hold - Sidelying Hip Circles  - 1-2 x daily - 7 x weekly - 1-2 sets - 10 reps - Sidelying Hip Adduction  - 1-2 x daily - 7 x weekly - 1-2 sets - 10 reps - Seated Straight Leg Raise  - 1 x daily - 7 x weekly - 3 sets - 10 reps  ASSESSMENT:  CLINICAL IMPRESSION: Able to progress into closed chain single leg exercises today without popping or pain.  Plan to continue x 2 more weeks and pt to follow up with MD at that time to discuss next steps.  Will continue to benefit from PT to maximize function.  OBJECTIVE IMPAIRMENTS: decreased activity tolerance, decreased endurance, decreased knowledge of condition, decreased strength, increased edema, obesity, and pain.   ACTIVITY LIMITATIONS: squatting, stairs, locomotion level, and high level workouts  PARTICIPATION LIMITATIONS: community activity and workouts  PERSONAL FACTORS:  SVT and obesity  are also affecting patient's functional outcome.   REHAB POTENTIAL: Good  CLINICAL DECISION MAKING: Stable/uncomplicated  EVALUATION COMPLEXITY: Low   GOALS: Goals reviewed with patient? Yes  SHORT TERM GOALS: Target date: 10/25/2022 Roshad will be independent with his day 1 home exercise program Baseline: Started 09/27/2022 Goal status: MET 10/08/22  2.  Printes will be independent with his home gym exercise program Baseline: Briefly discussed visit 1 and will be expanded on future visits Goal status: MET 10/24/22  3.  Reduce left thigh atrophy as assessed 15 cm proximal to the  superior patellar pole to 2 cm or less Baseline: 3 cm Goal status: IN PROGRESS 10/24/22   LONG TERM GOALS: Target date: 11/22/2022  Dany will score 63 or better on FOTO in 11 visits or less Baseline: 54 Goal status: INITIAL  2.  Bennett will report left knee pain consistently less than 2 out of 10 on the numeric pain rating scale Baseline: 3 out of 10 Goal status: INITIAL  3.  Stryder will have less than 1 cm of thigh atrophy as assessed 15 cm proximal to the superior patellar pole left versus right Baseline: 3 cm of atrophy Goal status: INITIAL  4.  Gery Pray will be independent with his long-term home exercise program at discharge Baseline: Started 09/27/2022 Goal status: INITIAL   PLAN:  PT FREQUENCY: 1-2x/week  PT DURATION: 8 weeks  PLANNED INTERVENTIONS: Therapeutic exercises, Therapeutic activity, Neuromuscular re-education, Balance training, Gait training, Patient/Family education, Self Care, Stair training, Electrical stimulation, Cryotherapy, and Vasopneumatic device  PLAN FOR NEXT SESSION: 2 more weeks of continued strengthening,  Continue blood flow restriction with basic quadriceps strengthening activities (3 cm atrophy left thigh vs right).     Clarita Crane, PT, DPT 11/01/22 8:47 AM

## 2022-11-06 ENCOUNTER — Encounter: Payer: Managed Care, Other (non HMO) | Admitting: Physical Therapy

## 2022-11-07 ENCOUNTER — Ambulatory Visit: Payer: Managed Care, Other (non HMO) | Admitting: Physical Therapy

## 2022-11-07 DIAGNOSIS — M6281 Muscle weakness (generalized): Secondary | ICD-10-CM

## 2022-11-07 DIAGNOSIS — M25562 Pain in left knee: Secondary | ICD-10-CM | POA: Diagnosis not present

## 2022-11-07 DIAGNOSIS — G8929 Other chronic pain: Secondary | ICD-10-CM | POA: Diagnosis not present

## 2022-11-07 NOTE — Therapy (Addendum)
OUTPATIENT PHYSICAL THERAPY TREATMENT   /DISCHARGE   Patient Name: Benjamin Norman MRN: [id number] DOB:17-Jan-1976, 47 y.o., male Today's Date: 11/07/2022  END OF SESSION:  PT End of Session - 11/07/22 0804     Visit Number 9    Number of Visits 12    Date for PT Re-Evaluation 11/22/22    Authorization - Number of Visits 9    PT Start Time 0800    PT Stop Time 0840    PT Time Calculation (min) 40 min    Activity Tolerance Patient tolerated treatment well;No increased pain    Behavior During Therapy [facility] for tasks assessed/performed                  Past Medical History:  Diagnosis Date   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    intermittent   Childhood asthma    COVID-19 09/02/2019   COVID-19 virus infection 08/2019   Fever blister    Heart murmur    as child   Other kyphosis (acquired)    thoracic   SVT (supraventricular tachycardia)    AV re entry Benjamin Norman)   Trochlear nerve palsy, 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[name])   Unspecified tinnitus    Past Surgical History:  Procedure Laterality Date   MOHS SURGERY     Patient Active Problem List   Diagnosis Date Noted   Hyperglycemia 09/17/2022   Left anterior knee pain 12/04/2021   Pulmonary granuloma (HCC) 09/14/2019   SVT (supraventricular tachycardia) 08/19/2019   Obesity, Class I, BMI 30-34.9 08/19/2019   Trochlear nerve palsy, right eye    Healthcare maintenance 02/09/2011   NEVUS, ATYPICAL 02/02/2010   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 Yes: NPRS scale: 0-4/10 Pain location: Infrapatellar left knee Pain description: Swelling Aggravating factors: Too much WB, jogging Relieving factors: Ice  PRECAUTIONS: None  WEIGHT BEARING RESTRICTIONS: No  FALLS:  Has patient fallen in last 6 months? No  LIVING ENVIRONMENT: Lives with: lives with their family and lives with their spouse Lives in: House/apartment Stairs:  Stairs are a problem going up and down Has following equipment at home: None  OCCUPATION: On a computer most of the day  PLOF: Independent  PATIENT GOALS: Get back to home gym (bike, treadmill, elliptical, free weights)  NEXT MD VISIT: ?  OBJECTIVE:   DIAGNOSTIC FINDINGS: [name] notes he has a "loose body" in his left knee  PATIENT SURVEYS:  FOTO 54 (Goal 63 in 11 visits)  COGNITION: Overall cognitive status: Within functional limits for tasks assessed     SENSATION: No complaints of peripheral pain or paresthesias  MUSCLE LENGTH: Hamstrings: Right 35 deg; Left 35 deg Quadriceps: Right 120 deg; Left 120 deg   LOWER EXTREMITY ROM:  Active ROM Right eval Left eval  Knee flexion 147 144  Knee extension 0 0   (Blank rows = not tested)  LOWER EXTREMITY MMT: 15 cm proximal to superior patellar pole  MMT  Right eval Left eval Left 10/24/22  Knee extension 54 cm 51 cm 51 cm        (Blank rows = not tested)  GAIT: Distance walked: In the office Assistive device utilized: None Level of assistance: Complete Independence Comments: [name] would like to return to running and doing Spartan races  BFR Pressure measures:   Exercise at 80%: Long sitting:  200 mm Hg  160 mm Hg Supine:   170 mm Hg  136 mm Hg Standing:  260 mm Hg  208 mm Hg  Cuff size 4  TODAY'S TREATMENT:                                                                                                                              DATE:   11/07/22 TherEx Recumbent bike Seat 8 Level 8 for 8 minutes Discussed ways to target VMO as trial for home - able to perform seated SLR with ER, popping noted with LAQ with ball squeeze so deferred that for now.  Recommended he continue all other home exercises as able.  Manual STM with compression to Lt lateral quad; skilled palpation and monitoring of soft tissue during DN Trigger Point Dry-Needling  Treatment instructions: Expect mild to moderate muscle soreness. S/S of pneumothorax if dry needled over a lung field, and to seek immediate medical attention should they occur. Patient verbalized understanding of these instructions and education.  Patient Consent Given: Yes Education handout provided: Yes Muscles treated: Lt lateral quad Electrical stimulation performed: Yes Parameters:  10-20 mA freq with intensity to tolerance, 2 channels x 5 min Treatment response/outcome: twitch response noted  11/01/22 TherEx Recumbent bike Seat 8 Level 8 for 8 minutes The following exercises with BFR with 30 sec rest between sets (see above for details) Leg press LLE only 50# x 30 reps; 3x15 (needing RLE for concentric) LLE on 2" step with RLE heel taps x30 reps; 3x15 Seated SLR x30 reps;  3x15 reps  10/24/22 TherEx Recumbent bike Seat 8 Level 8 for 8 minutes The following exercises with BFR with 30 sec rest between sets (see above for details) Leg press LLE only 50# x 30 reps; 3x15 (needing RLE for concentric) Sidelying hip circles 3 sets of 20 circles each direction Sidelying hip adduction pulse 3x20  Seated SLR 3x20 reps  10/22/22 TherEx Recumbent bike Seat 8 Level 8 for 8 minutes The following exercises with BFR with 30 sec rest between sets (see above for details) Leg press LLE only 43# x 13 reps then 37# x17; 3x15 (min A PRN from RLE) Sidelying hip abduction circles x 20 reps CW/CCW; 3 sets Sidelying hip adduction 3x20 reps on Lt Seated SLR 3x20 on Lt  10/19/2022 Recumbent bike Seat  10 Level 5-8 for 5 minutes Quadriceps sets supine with BFR at 136 mm Hg (136 mm Hg) 30/15/15/15 Seated straight leg raises with 2# 3 sets of 10 for 3 seconds, slow eccentrics, pause before lift Side  lie hip abduction 10X 3 seconds Alternating hip hike at counter top 2 sets of 10 3 seconds  Functional Activities: Single Leg Press with 37# 30/15/15/15 reps (4 sets) with 60 seconds rest between sets of 30/15/15/15 reps 160 mm Hg  PATIENT EDUCATION:  Education details: See above Person educated: Patient Education method: Explanation, Demonstration, Tactile cues, Verbal cues, and Handouts Education comprehension: verbalized understanding, returned demonstration, verbal cues required, tactile cues required, and needs further education  HOME EXERCISE PROGRAM: Access Code: [email or web address] URL: [email or web address] Date: 11/07/2022 Prepared by: [name]  Exercises - Supine Quadricep Sets  - 3 x daily - 7 x weekly - 3 sets - 10 reps - 5 second hold - Single Leg Knee Extension with Weight Machine  - 1 x daily - 7 x weekly - 3 sets - 10 reps - Lateral Step Down  - 1-2 x daily - 7 x weekly - 3 sets - 10 reps - Lunge Matrix on Slider  - 1-2 x daily - 7 x weekly - 2 sets - 5 reps - Standing Hip Hiking  - 3-5 x daily - 7 x weekly - 1 sets - 10 reps - 3 seconds hold - Sidelying Hip Circles  - 1-2 x daily - 7 x weekly - 1-2 sets - 10 reps - Sidelying Hip Adduction  - 1-2 x daily - 7 x weekly - 1-2 sets - 10 reps - Seated Straight Leg Raise  - 1 x daily - 7 x weekly - 3 sets - 10 reps - Seated Long Arc Quad with Hip Adduction  - 3 x daily - 7 x weekly - 1 sets - 10 reps - 3-4 sec hold  Patient Education - Trigger Point Dry Needling  ASSESSMENT:  CLINICAL IMPRESSION: Trial of DN to lateral quad today as it's possible his patella is tracking laterally.  Positive response initially but will plan to reassess next visit to determine if it was beneficial.  Sees MD next week to discuss  options.  OBJECTIVE IMPAIRMENTS: decreased activity tolerance, decreased endurance, decreased knowledge of condition, decreased strength, increased edema, obesity, and pain.   ACTIVITY LIMITATIONS: squatting, stairs, locomotion level, and high level workouts  PARTICIPATION LIMITATIONS: community activity and workouts  PERSONAL FACTORS:  SVT and obesity  are also affecting patient's functional outcome.   REHAB POTENTIAL: Good  CLINICAL DECISION MAKING: Stable/uncomplicated  EVALUATION COMPLEXITY: Low   GOALS: Goals reviewed with patient? Yes  SHORT TERM GOALS: Target date: 10/25/2022 K[place]on will be independent with his day 1 home exercise program Baseline: Started 09/27/2022 Goal status: MET 10/08/22  2.  Chong will be independent with his home gym exercise program Baseline: Briefly discussed visit 1 and will be expanded on future visits Goal status: MET 10/24/22  3.  Reduce left thigh atrophy as assessed 15 cm proximal to the superior patellar pole to 2 cm or less Baseline: 3 cm Goal status: IN PROGRESS 10/24/22   LONG TERM GOALS: Target date: 11/22/2022  Aime will score 63 or better on FOTO in 11 visits or less Baseline: 54 Goal status: INITIAL  2.  Leanord will report left knee pain consistently less than 2 out of 10 on the numeric pain rating scale Baseline: 3 out of 10 Goal status: INITIAL  3.  Krishi will have less than 1 cm of thigh atrophy as assessed 15 cm proximal to the superior patellar pole left versus right Baseline: 3 cm of atrophy Goal status: INITIAL  4.  Gery Pray will be independent with  his long-term home exercise program at discharge Baseline: Started 09/27/2022 Goal status: INITIAL   PLAN:  PT FREQUENCY: 1-2x/week  PT DURATION: 8 weeks  PLANNED INTERVENTIONS: Therapeutic exercises, Therapeutic activity, Neuromuscular re-education, Balance training, Gait training, Patient/Family education, Self Care, Stair training, Electrical stimulation, Cryotherapy, and  Vasopneumatic device  PLAN FOR NEXT SESSION: how was DN, recert of d/c based on MD recommendations,   continued strengthening,  Continue blood flow restriction with basic quadriceps strengthening activities (3 cm atrophy left thigh vs right).     Clarita Crane, PT, DPT 11/07/22 8:43 AM    PHYSICAL THERAPY DISCHARGE SUMMARY  Visits from Start of Care: 9  Current functional level related to goals / functional outcomes: See note   Remaining deficits: See note   Education / Equipment: HEP  Patient goals were partially met. Patient is being discharged due to not returning since the last visit.  Chyrel Masson, PT, DPT, OCS, ATC 12/13/22  1:30 PM

## 2022-11-11 NOTE — Progress Notes (Unsigned)
    Benjamin Norman T. Benjamin Shultis, MD, CAQ [facility] [address] [place], [address]  Phone: [phone]  FAX: [phone]  Benjamin Norman - 47 y.o. male  MRN [id number]  Date of Birth: 1975-11-04  Date: 11/12/2022  PCP: [name], MD  Referral: [name], MD  No chief complaint on file.  Subjective:   Benjamin Norman is a 47 y.o. very pleasant male patient with There is no height or weight on file to calculate BMI. who presents with the following:  Is here to follow-up regarding his left knee, and I did see him for some chronic pain in the left knee in September 24, 2022.  At that point, I did notice a fairly large loose body on his x-ray, as well.  He has been doing formal physical therapy, and he is here to follow-up with me.   Review of Systems is noted in the HPI, as appropriate  Objective:   There were no vitals taken for this visit.  GEN: No acute distress; alert,appropriate. PULM: Breathing comfortably in no respiratory distress PSYCH: Normally interactive.   Laboratory and Imaging Data: CLINICAL DATA:  Pain   EXAM: LEFT KNEE - COMPLETE 4+ VIEW   COMPARISON:  None Available.   FINDINGS: Enthesopathic changes off the superior patella. Mild tilting of the patella laterally on the sunrise view. Irregularity along the inferior posterior aspect of the patella on the lateral view. No acute fracture or dislocation. No joint effusion. A soft tissue calcification projects inferior to the posterior aspect of the patella on the lateral view only.   IMPRESSION: 1. Enthesopathic changes off the superior patella. 2. Mild tilting of the patella laterally on the sunrise view. 3. Irregularity along the inferior posterior aspect of the patella on the lateral view could be degenerative or from remote trauma. 4. A soft tissue calcification projects inferior to the posterior aspect of the patella on the lateral  view only may represent a loose body.     Electronically Signed   By: Gerome Sam III M.D.   On: 09/27/2022 13:01    Assessment and Plan:   ***

## 2022-11-12 ENCOUNTER — Encounter: Payer: Self-pay | Admitting: Physical Therapy

## 2022-11-12 ENCOUNTER — Encounter: Payer: Self-pay | Admitting: Family Medicine

## 2022-11-12 ENCOUNTER — Encounter: Payer: Managed Care, Other (non HMO) | Admitting: Physical Therapy

## 2022-11-12 ENCOUNTER — Ambulatory Visit: Payer: Managed Care, Other (non HMO) | Admitting: Family Medicine

## 2022-11-12 VITALS — BP 122/84 | HR 74 | Temp 98.0°F | Ht 70.0 in | Wt 218.2 lb

## 2022-11-12 DIAGNOSIS — G8929 Other chronic pain: Secondary | ICD-10-CM | POA: Diagnosis not present

## 2022-11-12 DIAGNOSIS — M2342 Loose body in knee, left knee: Secondary | ICD-10-CM

## 2022-11-12 DIAGNOSIS — M25562 Pain in left knee: Secondary | ICD-10-CM | POA: Diagnosis not present

## 2022-11-13 ENCOUNTER — Other Ambulatory Visit (INDEPENDENT_AMBULATORY_CARE_PROVIDER_SITE_OTHER): Payer: Managed Care, Other (non HMO)

## 2022-11-13 ENCOUNTER — Encounter: Payer: Self-pay | Admitting: Family Medicine

## 2022-11-13 DIAGNOSIS — E669 Obesity, unspecified: Secondary | ICD-10-CM | POA: Diagnosis not present

## 2022-11-13 DIAGNOSIS — R739 Hyperglycemia, unspecified: Secondary | ICD-10-CM

## 2022-11-13 LAB — COMPREHENSIVE METABOLIC PANEL
ALT: 25 U/L (ref 0–53)
AST: 20 U/L (ref 0–37)
Albumin: 4.3 g/dL (ref 3.5–5.2)
Alkaline Phosphatase: 50 U/L (ref 39–117)
BUN: 12 mg/dL (ref 6–23)
CO2: 26 mEq/L (ref 19–32)
Calcium: 9.3 mg/dL (ref 8.4–10.5)
Chloride: 106 mEq/L (ref 96–112)
Creatinine, Ser: 0.87 mg/dL (ref 0.40–1.50)
GFR: 103.09 mL/min (ref >60.00)
Glucose, Bld: 96 mg/dL (ref 70–99)
Potassium: 3.9 mEq/L (ref 3.5–5.1)
Sodium: 139 mEq/L (ref 135–145)
Total Bilirubin: 0.9 mg/dL (ref 0.2–1.2)
Total Protein: 6.9 g/dL (ref 6.0–8.3)

## 2022-11-13 LAB — LIPID PANEL
Cholesterol: 171 mg/dL (ref 0–200)
HDL: 50.4 mg/dL (ref >39.00)
LDL Cholesterol: 100 mg/dL — ABNORMAL HIGH (ref 0–99)
NonHDL: 120.3
Total CHOL/HDL Ratio: 3
Triglycerides: 103 mg/dL (ref 0.0–149.0)
VLDL: 20.6 mg/dL (ref 0.0–40.0)

## 2022-11-13 LAB — TSH: TSH: 1.45 u[IU]/mL (ref 0.35–5.50)

## 2022-11-13 LAB — HEMOGLOBIN A1C: Hgb A1c MFr Bld: 5.1 % (ref 4.6–6.5)

## 2022-11-15 ENCOUNTER — Encounter: Payer: Managed Care, Other (non HMO) | Admitting: Rehabilitative and Restorative Service Providers"

## 2022-11-15 ENCOUNTER — Telehealth: Payer: Self-pay | Admitting: Family Medicine

## 2022-11-15 NOTE — Telephone Encounter (Signed)
I have been trying to get this approved by insurance, it is under review with his insurance. I have submitted clinicals as they requested. Some insurances have a longer process for approval than others. I will reach out and explain this to the patient.

## 2022-11-15 NOTE — Telephone Encounter (Signed)
Please return call  My paperwork was completed right after I saw him.  I suspect that it is the "prior authorization" required by his insurance.  I will alert the schedulers about it, but I would want to be certain that it is covered.   I will allow the scheduling team to help coordinate this for him.

## 2022-11-15 NOTE — Telephone Encounter (Signed)
Patient called in stating that MRI called stating that they had a cancellation for this afternoon,however they are awaiting some paper work from Dr [employer] in order for him to have MRI done. He would like to know if what they are needed can be completed and sent back to them so that he could go this afternoon to have MRI done?

## 2022-11-15 NOTE — Telephone Encounter (Signed)
Spoke with patient, he is aware that I am still waiting on Auth from his insurance and that as soon as I am made aware of approval I will let him know. He is also aware that he can reschedule his scan to a sooner appt once we have approval on file.   See referral notes for updates

## 2022-11-20 ENCOUNTER — Encounter: Payer: Self-pay | Admitting: Family Medicine

## 2022-11-20 ENCOUNTER — Ambulatory Visit (INDEPENDENT_AMBULATORY_CARE_PROVIDER_SITE_OTHER): Payer: Managed Care, Other (non HMO) | Admitting: Family Medicine

## 2022-11-20 VITALS — BP 126/78 | HR 77 | Temp 98.2°F | Ht 69.5 in | Wt 217.5 lb

## 2022-11-20 DIAGNOSIS — M25562 Pain in left knee: Secondary | ICD-10-CM | POA: Diagnosis not present

## 2022-11-20 DIAGNOSIS — G8929 Other chronic pain: Secondary | ICD-10-CM

## 2022-11-20 DIAGNOSIS — Z0000 Encounter for general adult medical examination without abnormal findings: Secondary | ICD-10-CM | POA: Diagnosis not present

## 2022-11-20 DIAGNOSIS — Z1211 Encounter for screening for malignant neoplasm of colon: Secondary | ICD-10-CM

## 2022-11-20 DIAGNOSIS — E669 Obesity, unspecified: Secondary | ICD-10-CM | POA: Diagnosis not present

## 2022-11-20 DIAGNOSIS — I471 Supraventricular tachycardia, unspecified: Secondary | ICD-10-CM | POA: Diagnosis not present

## 2022-11-20 MED ORDER — METOPROLOL TARTRATE 25 MG PO TABS: 12.5000 mg | ORAL_TABLET | Freq: Two times a day (BID) | ORAL | 1 refills | Status: DC | PRN

## 2022-11-20 NOTE — Assessment & Plan Note (Signed)
Infrequent episodes about twice a year.  Metoprolol PRN refilled to have on hand if needed.

## 2022-11-20 NOTE — Assessment & Plan Note (Signed)
Preventative protocols reviewed and updated unless pt declined. Discussed healthy diet and lifestyle.  

## 2022-11-20 NOTE — Assessment & Plan Note (Signed)
Pending MRI through sports medicine. Appreciate Dr Patsy Lager care.

## 2022-11-20 NOTE — Assessment & Plan Note (Addendum)
Encourage healthy diet and lifestyle choices to affect sustainable weight loss. He is looking into becoming less sedentary at work. He continues regular exercise at home - stationary bicycle

## 2022-11-20 NOTE — Patient Instructions (Addendum)
Pass by lab to pick up stool kit.  May take metoprolol 1/2-1 tab as needed for SVT racing heart.  Return as needed or in 1 year for next physical  Good to see you today

## 2022-11-20 NOTE — Progress Notes (Signed)
Ph: [phone] Fax: [phone]   Patient ID: Benjamin Norman, male    DOB: [birth year], 47 y.o.   MRN: [id number]  This visit was conducted in person.  BP 126/78   Pulse 77   Temp 98.2 F (36.8 C) (Temporal)   Ht 5' 9.5" (1.765 m)   Wt 217 lb 8 oz (98.7 kg)   SpO2 98%   BMI 31.66 kg/m    CC: CPE Subjective:   HPI: Benjamin Norman is a 47 y.o. male presenting on 11/20/2022 for Annual Exam   98yo grandfather in-law recently passed away.   H/o AV re entry SVT previously prescribed metoprolol PRN but doesn't use this.  Episode last month of tachycardia that lasted 30 min (longer than normal), finally improved after vagal maneuver. Wonders if dehydration contributed. Episodes stable in frequency, occur about twice a year. Last saw cardiology 2017.   Known congenital CN 4 palsy (right).   Has been seeing sports medicine Dr Patsy Lager for chronic L knee pain - most recently ordered MRI knee to eval for large loose body. Failed conservative management including PT x 9 sessions.    Preventative: Colon cancer screening - yearly iFOB latest neg 10/2021 Flu shot yearly at work  COVID vaccine - declined Td 2009. Tdap 08/2019  Seat belt use discussed  Sunscreen use discussed. Sees dermatologist yearly for h/o atypical nevi.  Sleep - averaging 7-8 hours/night.  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 Interim medical history since our last visit reviewed. Allergies and medications reviewed and updated. Outpatient Medications Prior to Visit  Medication Sig Dispense Refill   cetirizine (ZYRTEC) 10 MG tablet Take 10 mg by mouth daily at 12 noon.      Glucosamine-Chondroitin-MSM 1500-1200-500 MG PACK Take 1 tablet by mouth daily.     Multiple Vitamin (MULTIVITAMINS PO) Take by mouth daily.     Omega-3 1400 MG CAPS Take 2 capsules by mouth daily.     OVER THE COUNTER MEDICATION Modere Immune: Zinc 7.5/Elderberry 95 mg 1 teaspoon by mouth daily     OVER THE COUNTER MEDICATION Modere Life 1 teaspoon by mouth daily     No facility-administered medications prior to visit.     Per HPI unless specifically indicated in ROS section below Review of Systems  Constitutional:  Positive for fatigue (feeling more tired). Negative for activity change, appetite change, chills, fever and unexpected weight change.  HENT:  Negative for hearing loss.   Eyes:  Negative for visual disturbance.  Respiratory:  Negative for cough, chest tightness, shortness of breath and wheezing.   Cardiovascular:  Positive for palpitations (see HPI). Negative for chest pain and leg swelling.  Gastrointestinal:  Negative for abdominal distention, abdominal pain, blood in stool, constipation, diarrhea, nausea and vomiting.  Genitourinary:  Negative for difficulty urinating and hematuria.  Musculoskeletal:  Negative for arthralgias, myalgias and neck pain.  Skin:  Negative for rash.  Neurological:  Negative for dizziness, seizures, syncope and headaches.  Hematological:  Negative for adenopathy. Does not bruise/bleed easily.  Psychiatric/Behavioral:  Negative for dysphoric mood. The patient is not nervous/anxious.     Objective:  BP 126/78  Pulse 77   Temp 98.2 F (36.8 C) (Temporal)   Ht 5' 9.5" (1.765 m)   Wt 217 lb 8 oz (98.7 kg)   SpO2 98%   BMI 31.66 kg/m   Wt Readings from Last 3 Encounters:  11/20/22 217 lb 8 oz (98.7 kg)  11/12/22 218 lb 4 oz (99 kg)  09/24/22 222 lb 6 oz (100.9 kg)      Physical Exam Vitals and nursing note reviewed.  Constitutional:      General: He is not in acute distress.    Appearance: Normal appearance. He is well-developed. He  is not ill-appearing.  HENT:     Head: Normocephalic and atraumatic.     Right Ear: Hearing, tympanic membrane, ear canal and external ear normal.     Left Ear: Hearing, tympanic membrane, ear canal and external ear normal.     Mouth/Throat:     Mouth: Mucous membranes are moist.     Pharynx: Oropharynx is clear. No oropharyngeal exudate or posterior oropharyngeal erythema.  Eyes:     General: No scleral icterus.    Extraocular Movements: Extraocular movements intact.     Conjunctiva/sclera: Conjunctivae normal.     Pupils: Pupils are equal, round, and reactive to light.  Neck:     Thyroid: No thyroid mass or thyromegaly.  Cardiovascular:     Rate and Rhythm: Normal rate and regular rhythm.     Pulses: Normal pulses.          Radial pulses are 2+ on the right side and 2+ on the left side.     Heart sounds: Normal heart sounds. No murmur heard. Pulmonary:     Effort: Pulmonary effort is normal. No respiratory distress.     Breath sounds: Normal breath sounds. No wheezing, rhonchi or rales.  Abdominal:     General: Bowel sounds are normal. There is no distension.     Palpations: Abdomen is soft. There is no mass.     Tenderness: There is no abdominal tenderness. There is no guarding or rebound.     Hernia: No hernia is present.  Musculoskeletal:        General: Normal range of motion.     Cervical back: Normal range of motion and neck supple.     Right lower leg: No edema.     Left lower leg: No edema.  Lymphadenopathy:     Cervical: No cervical adenopathy.  Skin:    General: Skin is warm and dry.     Findings: No rash.  Neurological:     General: No focal deficit present.     Mental Status: He is alert and oriented to person, place, and time.  Psychiatric:        Mood and Affect: Mood normal.        Behavior: Behavior normal.        Thought Content: Thought content normal.        Judgment: Judgment normal.       Results for orders placed or performed in visit on  11/13/22  TSH  Result Value Ref Range   TSH 1.45 0.35 - 5.50 uIU/mL  Hemoglobin A1c  Result Value Ref Range   Hgb A1c MFr Bld 5.1 4.6 - 6.5 %  Comprehensive metabolic panel  Result Value Ref Range   Sodium 139 135 - 145 mEq/L   Potassium 3.9 3.5 - 5.1 mEq/L   Chloride 106 96 - 112 mEq/L   CO2 26 19 - 32 mEq/L  Glucose, Bld 96 70 - 99 mg/dL   BUN 12 6 - 23 mg/dL   Creatinine, Ser 1.61 0.40 - 1.50 mg/dL   Total Bilirubin 0.9 0.2 - 1.2 mg/dL   Alkaline Phosphatase 50 39 - 117 U/L   AST 20 0 - 37 U/L   ALT 25 0 - 53 U/L   Total Protein 6.9 6.0 - 8.3 g/dL   Albumin 4.3 3.5 - 5.2 g/dL   GFR 096.04 >54.09 mL/min   Calcium 9.3 8.4 - 10.5 mg/dL  Lipid panel  Result Value Ref Range   Cholesterol 171 0 - 200 mg/dL   Triglycerides 811.9 0.0 - 149.0 mg/dL   HDL 14.78 >29.56 mg/dL   VLDL 21.3 0.0 - 08.6 mg/dL   LDL Cholesterol 578 (H) 0 - 99 mg/dL   Total CHOL/HDL Ratio 3    NonHDL 120.30     Assessment & Plan:   Problem List Items Addressed This Visit     Healthcare maintenance - Primary (Chronic)    Preventative protocols reviewed and updated unless pt declined. Discussed healthy diet and lifestyle.       SVT (supraventricular tachycardia)    Infrequent episodes about twice a year.  Metoprolol PRN refilled to have on hand if needed.       Relevant Medications   metoprolol tartrate (LOPRESSOR) 25 MG tablet   Obesity, Class I, BMI 30-34.9    Encourage healthy diet and lifestyle choices to affect sustainable weight loss. He is looking into becoming less sedentary at work. He continues regular exercise at home - stationary bicycle      Chronic pain of left knee    Pending MRI through sports medicine. Appreciate Dr Patsy Lager care.       Other Visit Diagnoses     Special screening for malignant neoplasms, colon       Relevant Orders   Fecal occult blood, imunochemical        Meds ordered this encounter  Medications   metoprolol tartrate (LOPRESSOR) 25 MG tablet     Sig: Take 0.5-1 tablets (12.5-25 mg total) by mouth 2 (two) times daily as needed (tachycardia (SVT)).    Dispense:  20 tablet    Refill:  1    Orders Placed This Encounter  Procedures   Fecal occult blood, imunochemical    Standing Status:   Future    Standing Expiration Date:   11/20/2023    Patient Instructions  Pass by lab to pick up stool kit.  May take metoprolol 1/2-1 tab as needed for SVT racing heart.  Return as needed or in 1 year for next physical  Good to see you today   Follow up plan: Return in about 1 year (around 11/20/2023) for annual exam, prior fasting for blood work.  [name], MD

## 2022-11-23 ENCOUNTER — Other Ambulatory Visit: Payer: Self-pay | Admitting: *Deleted

## 2022-11-23 DIAGNOSIS — Z1211 Encounter for screening for malignant neoplasm of colon: Secondary | ICD-10-CM

## 2022-11-23 LAB — FECAL OCCULT BLOOD, IMMUNOCHEMICAL: Fecal Occult Bld: NEGATIVE

## 2022-12-10 ENCOUNTER — Ambulatory Visit
Admission: RE | Admit: 2022-12-10 | Discharge: 2022-12-10 | Disposition: A | Discharge status: Home / Self Care | Payer: Managed Care, Other (non HMO) | Source: Ambulatory Visit | Attending: Family Medicine | Admitting: Family Medicine

## 2022-12-10 DIAGNOSIS — M2342 Loose body in knee, left knee: Secondary | ICD-10-CM

## 2022-12-10 DIAGNOSIS — G8929 Other chronic pain: Secondary | ICD-10-CM

## 2022-12-26 ENCOUNTER — Encounter: Payer: Self-pay | Admitting: Family Medicine

## 2022-12-26 DIAGNOSIS — M2342 Loose body in knee, left knee: Secondary | ICD-10-CM

## 2022-12-26 DIAGNOSIS — M1712 Unilateral primary osteoarthritis, left knee: Secondary | ICD-10-CM

## 2023-01-07 ENCOUNTER — Encounter: Payer: Self-pay | Admitting: Orthopedic Surgery

## 2023-01-07 ENCOUNTER — Ambulatory Visit: Payer: Managed Care, Other (non HMO) | Admitting: Orthopedic Surgery

## 2023-01-07 DIAGNOSIS — M2342 Loose body in knee, left knee: Secondary | ICD-10-CM | POA: Diagnosis not present

## 2023-01-07 NOTE — Progress Notes (Unsigned)
   Office Visit Note   Patient: Benjamin Norman           Date of Birth: [birth year]           MRN: [id number] Visit Date: 01/07/2023 Requested by: [name], MD [address] [place],  [place] [address] PCP: [name], MD  Subjective: Chief Complaint  Patient presents with  . Other    Left knee weakness/instability    HPI: Benjamin Norman is a 47 y.o. male who presents to the office reporting ***.                ROS: All systems reviewed are negative as they relate to the chief complaint within the history of present illness.  Patient denies fevers or chills.  Assessment & Plan: Visit Diagnoses: No diagnosis found.  Plan: ***  Follow-Up Instructions: No follow-ups on file.   Orders:  No orders of the defined types were placed in this encounter.  No orders of the defined types were placed in this encounter.     Procedures: No procedures performed   Clinical Data: No additional findings.  Objective: Vital Signs: There were no vitals taken for this visit.  Physical Exam:  Constitutional: Patient appears well-developed HEENT:  Head: Normocephalic Eyes:EOM are normal Neck: Normal range of motion Cardiovascular: Normal rate Pulmonary/chest: Effort normal Neurologic: Patient is alert Skin: Skin is warm Psychiatric: Patient has normal mood and affect  Ortho Exam: ***  Specialty Comments:  No specialty comments available.  Imaging: No results found.   PMFS History: Patient Active Problem List   Diagnosis Date Noted  . Hyperglycemia 09/17/2022  . Chronic pain of left knee 12/04/2021  . 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 SVT (supraventricular tachycardia) 08/19/2019  . Obesity, Class I, BMI 30-34.9 08/19/2019  . Trochlear nerve palsy, right eye   . Healthcare maintenance 02/09/2011  . NEVUS, ATYPICAL 02/02/2010  . 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 Childhood asthma   . COVID-19 09/02/2019  . COVID-19 virus infection 08/2019  . Fever blister   . Heart murmur    as child  . Other kyphosis (acquired)    thoracic  . SVT (supraventricular tachycardia)    AV re entry [name]  . 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 Unspecified tinnitus     Family History  Problem Relation Age of Onset  . Hypertension Mother   . Hyperlipidemia Mother   . Healthy Father   . Healthy Brother   . Coronary artery disease Paternal Grandfather   . Alcohol abuse Paternal Grandfather   . Heart attack Paternal Grandfather   . Cancer Neg Hx   . Stroke Neg Hx   . Diabetes Neg Hx     Past Surgical History:  Procedure Laterality Date  . MOHS SURGERY     Social History   Occupational History  . Occupation: Camera operator  . Occupation: Youth pastor  Tobacco Use  . Smoking status: Never  . Smokeless tobacco: Never  Vaping Use  . Vaping status: Never Used  Substance and Sexual Activity  . Alcohol use: No  . Drug use: No  . Sexual activity: Not on file

## 2023-01-17 ENCOUNTER — Other Ambulatory Visit: Payer: Self-pay | Admitting: Surgical

## 2023-01-17 ENCOUNTER — Encounter: Payer: Self-pay | Admitting: Orthopedic Surgery

## 2023-01-17 DIAGNOSIS — S83282A Other tear of lateral meniscus, current injury, left knee, initial encounter: Secondary | ICD-10-CM

## 2023-01-17 DIAGNOSIS — M94262 Chondromalacia, left knee: Secondary | ICD-10-CM

## 2023-01-17 DIAGNOSIS — M2342 Loose body in knee, left knee: Secondary | ICD-10-CM

## 2023-01-17 MED ORDER — CELECOXIB 100 MG PO CAPS: 100.0000 mg | ORAL_CAPSULE | Freq: Two times a day (BID) | ORAL | 0 refills | Status: DC

## 2023-01-17 MED ORDER — OXYCODONE HCL 5 MG PO TABS: 5.0000 mg | ORAL_TABLET | ORAL | 0 refills | Status: DC | PRN

## 2023-01-17 MED ORDER — METHOCARBAMOL 500 MG PO TABS: 500.0000 mg | ORAL_TABLET | Freq: Three times a day (TID) | ORAL | 1 refills | Status: DC | PRN

## 2023-01-17 MED ORDER — ASPIRIN 81 MG PO CHEW: 81.0000 mg | CHEWABLE_TABLET | Freq: Two times a day (BID) | ORAL | 0 refills | Status: AC

## 2023-01-25 ENCOUNTER — Ambulatory Visit (INDEPENDENT_AMBULATORY_CARE_PROVIDER_SITE_OTHER): Payer: Managed Care, Other (non HMO) | Admitting: Surgical

## 2023-01-25 DIAGNOSIS — Z9889 Other specified postprocedural states: Secondary | ICD-10-CM

## 2023-01-27 ENCOUNTER — Encounter: Payer: Self-pay | Admitting: Surgical

## 2023-01-27 NOTE — Progress Notes (Signed)
Post-Op Visit Note   Patient: Benjamin Norman           Date of Birth: [birth year]           MRN: [id number] Visit Date: 01/25/2023 PCP: [name], MD   Assessment & Plan:  Chief Complaint:  Chief Complaint  Patient presents with   Left Knee - Routine Post Op   Visit Diagnoses:  1. S/P arthroscopy of left knee     Plan: Patient is a 47 year old male who presents for evaluation s/p left knee arthroscopy with meniscal debridement and removal of loose bodies as well as microfracture on 01/17/2023.  He is a little over a week out.  Ambulating with crutches and toe-touch weightbearing.  No chest pain or shortness of breath.  No fevers or chills.  Taking aspirin for DVT prophylaxis.  Only taking Celebrex for pain control.  On exam, patient has 7 degrees extension and 100 degrees of knee flexion.  Mild to moderate effusion present.  No calf tenderness.  Negative Homans' sign.  Able to perform straight leg raise without extensor lag.  Incisions are healing well with sutures intact.  Sutures removed and replaced with Steri-Strips without dehiscence of the incision.  No cellulitis or skin changes noted.  Palpable DP pulse.  Intact ankle Flexion and plantarflexion.  Plan is to continue toe-touch weightbearing for 2 more weeks and then slowly increase weightbearing and wean off crutches over the next 2 weeks.  Follow-up for clinical recheck with Dr. August Saucer in 4 weeks.  In the meantime he will work on optimizing his knee range of motion, especially focusing on extension which he is somewhat lacking today.  Follow-Up Instructions: No follow-ups on file.   Orders:  No orders of the defined types were placed in this encounter.  No orders of the defined types were placed in this encounter.   Imaging: No results found.  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   Past Medical History:  Diagnosis Date   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    intermittent   Childhood asthma    COVID-19 09/02/2019   COVID-19 virus infection 08/2019   Fever blister    Heart murmur    as child   Other kyphosis (acquired)    thoracic   SVT (supraventricular tachycardia)    AV re entry [name]   Trochlear nerve palsy, right eye    congenital Teacher, English as a foreign language)   Unspecified tinnitus     Family History  Problem Relation Age of Onset   Hypertension Mother    Hyperlipidemia Mother    Healthy Father    Healthy Brother    Coronary artery disease Paternal Grandfather    Alcohol abuse Paternal Grandfather    Heart attack Paternal Grandfather    Cancer Neg Hx    Stroke Neg Hx    Diabetes Neg Hx     Past Surgical History:  Procedure Laterality Date   MOHS SURGERY     Social History   Occupational History   Occupation: Camera operator   Occupation: Youth pastor  Tobacco Use   Smoking status: Never   Smokeless tobacco: Never  Vaping  Use   Vaping status: Never Used  Substance and Sexual Activity   Alcohol use: No   Drug use: No   Sexual activity: Not on file

## 2023-02-13 ENCOUNTER — Encounter: Payer: Self-pay | Admitting: Orthopedic Surgery

## 2023-02-25 ENCOUNTER — Encounter: Payer: Self-pay | Admitting: Orthopedic Surgery

## 2023-02-25 ENCOUNTER — Ambulatory Visit (INDEPENDENT_AMBULATORY_CARE_PROVIDER_SITE_OTHER): Payer: Managed Care, Other (non HMO) | Admitting: Orthopedic Surgery

## 2023-02-25 DIAGNOSIS — Z9889 Other specified postprocedural states: Secondary | ICD-10-CM

## 2023-02-25 NOTE — Progress Notes (Signed)
Post-Op Visit Note   Patient: Benjamin Norman           Date of Birth: June 20, 1975           MRN: [id number] Visit Date: 02/25/2023 PCP: [name], MD   Assessment & Plan:  Chief Complaint:  Chief Complaint  Patient presents with   Left Knee - Routine Post Op    left knee arthroscopy with meniscal debridement and removal of loose bodies as well as microfracture on 01/17/2023   Visit Diagnoses:  1. S/P arthroscopy of left knee     Plan:.  The patient is now about 5 weeks out left knee arthroscopy with meniscal debridement loose bodies and microfracture.  On examination he has trace effusion but good range of motion.  Does have a lot of crepitus with active knee extension.  No medications.  On examination he also has some quad atrophy on the left compared to the right.  No calf tenderness negative Homans.  At this time he is going to start doing some recumbent bike and we talked about how to do leg press in a patella friendly manner.  Follow-up in 6 weeks for final check  Follow-Up Instructions: No follow-ups on file.   Orders:  No orders of the defined types were placed in this encounter.  No orders of the defined types were placed in this encounter.   Imaging: No results found.  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   Past Medical History:  Diagnosis Date   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    intermittent   Childhood asthma    COVID-19 09/02/2019   COVID-19 virus infection 08/2019   Fever blister    Heart murmur    as child   Other kyphosis (acquired)    thoracic   SVT (supraventricular  tachycardia)    AV re entry [name]   Trochlear nerve palsy, right eye    congenital Teacher, English as a foreign language)   Unspecified tinnitus     Family History  Problem Relation Age of Onset   Hypertension Mother    Hyperlipidemia Mother    Healthy Father    Healthy Brother    Coronary artery disease Paternal Grandfather    Alcohol abuse Paternal Grandfather    Heart attack Paternal Grandfather    Cancer Neg Hx    Stroke Neg Hx    Diabetes Neg Hx     Past Surgical History:  Procedure Laterality Date   MOHS SURGERY     Social History   Occupational History   Occupation: Camera operator   Occupation: Youth pastor  Tobacco Use   Smoking status: Never   Smokeless tobacco: Never  Vaping Use   Vaping status: Never Used  Substance and Sexual Activity   Alcohol use: No   Drug use: No   Sexual activity: Not on file

## 2023-04-08 ENCOUNTER — Ambulatory Visit (INDEPENDENT_AMBULATORY_CARE_PROVIDER_SITE_OTHER): Payer: Managed Care, Other (non HMO) | Admitting: Orthopedic Surgery

## 2023-04-08 DIAGNOSIS — Z9889 Other specified postprocedural states: Secondary | ICD-10-CM

## 2023-04-10 ENCOUNTER — Encounter: Payer: Self-pay | Admitting: Orthopedic Surgery

## 2023-04-10 NOTE — Progress Notes (Signed)
Post-Op Visit Note   Patient: Benjamin Norman           Date of Birth: 05-15-1976           MRN: [id number] Visit Date: 04/08/2023 PCP: [name], MD   Assessment & Plan:  Chief Complaint:  Chief Complaint  Patient presents with   Left Knee - Routine Post Op      left knee arthroscopy with meniscal debridement and removal of loose bodies as well as microfracture on 01/17/2023     Visit Diagnoses:  1. S/P arthroscopy of left knee     Plan: Benjamin Norman underwent left knee arthroscopy with meniscal debridement and removal of loose bodies as well as microfracture following patellar instability.  Patient reports continued mechanical symptoms in that left knee which is not unexpected but he is not having much pain or any pain.  He has been walking on the treadmill.  Doing some leg press with light weight and high reps.  He does not report any swelling.  He is able to do the recumbent bike as well.  Whenever he does weighted leg extension the crepitus increases in the left knee.  On examination he has full range of motion and mild effusion.  Crepitus is present more on the left than the right.  Negative patellar apprehension.  Collateral crucial ligaments are stable.  Has about 1/2 cm of atrophy in the leg on the left compared to the right in the thigh.  Overall Benjamin Norman is doing well.  I think he has tailored his workouts to what the knee can accommodate.  I think his leg will continue to get stronger.  He will follow-up with [place] as needed.  May need episodic aspiration and injection in the knee based on his operative findings.  Follow-Up Instructions: No follow-ups on file.   Orders:  No orders of the defined types were placed in this encounter.  No orders of the defined types were placed in this encounter.   Imaging: No results found.  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   Past Medical History:  Diagnosis Date   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    intermittent   Childhood asthma    COVID-19 09/02/2019   COVID-19 virus infection 08/2019   Fever blister    Heart murmur    as child   Other kyphosis (acquired)    thoracic   SVT (supraventricular tachycardia)    AV re entry [name]   Trochlear nerve palsy, right eye    congenital Teacher, English as a foreign language)   Unspecified tinnitus     Family History  Problem Relation Age of Onset   Hypertension Mother    Hyperlipidemia Mother    Healthy Father    Healthy Brother    Coronary artery disease Paternal Grandfather    Alcohol abuse Paternal Grandfather    Heart attack Paternal Grandfather    Cancer Neg Hx    Stroke Neg Hx    Diabetes Neg Hx     Past Surgical History:  Procedure Laterality Date   MOHS SURGERY     Social History   Occupational History   Occupation:  Camera operator   Occupation: Youth pastor  Tobacco Use   Smoking status: Never   Smokeless tobacco: Never  Vaping Use   Vaping status: Never Used  Substance and Sexual Activity   Alcohol use: No   Drug use: No   Sexual activity: Not on file

## 2023-07-19 ENCOUNTER — Telehealth: Payer: Managed Care, Other (non HMO) | Admitting: Physician Assistant

## 2023-07-19 DIAGNOSIS — J101 Influenza due to other identified influenza virus with other respiratory manifestations: Secondary | ICD-10-CM

## 2023-07-19 MED ORDER — OSELTAMIVIR PHOSPHATE 75 MG PO CAPS: 75.0000 mg | ORAL_CAPSULE | Freq: Two times a day (BID) | ORAL | 0 refills | Status: DC

## 2023-07-19 NOTE — Progress Notes (Signed)
E visit for Flu like symptoms   We are sorry that you are not feeling well.  Here is how we plan to help! Based on what you have shared with me it looks like you may have a respiratory virus that may be influenza.  Influenza or "the flu" is   an infection caused by a respiratory virus. The flu virus is highly contagious and persons who did not receive their yearly flu vaccination may "catch" the flu from close contact.  We have anti-viral medications to treat the viruses that cause this infection. They are not a "cure" and only shorten the course of the infection. These prescriptions are most effective when they are given within the first 2 days of "flu" symptoms. Antiviral medication are indicated if you have a high risk of complications from the flu. You should  also consider an antiviral medication if you are in close contact with someone who is at risk. These medications can help patients avoid complications from the flu  but have side effects that you should know. Possible side effects from Tamiflu or oseltamivir include nausea, vomiting, diarrhea, dizziness, headaches, eye redness, sleep problems or other respiratory symptoms. You should not take Tamiflu if you have an allergy to oseltamivir or any to the ingredients in Tamiflu.  Based upon your symptoms and potential risk factors I have prescribed Oseltamivir (Tamiflu).  It has been sent to your designated pharmacy.  You will take one 75 mg capsule orally twice a day for the next 5 days.  You can continue OTC medications of choice for symptom management.  ANYONE WHO HAS FLU SYMPTOMS SHOULD: Stay home. The flu is highly contagious and going out or to work exposes others! Be sure to drink plenty of fluids. Water is fine as well as fruit juices, sodas and electrolyte beverages. You may want to stay away from caffeine or alcohol. If you are nauseated, try taking small sips of liquids. How do you know if you are getting enough fluid? Your urine  should be a pale yellow or almost colorless. Get rest. Taking a steamy shower or using a humidifier may help nasal congestion and ease sore throat pain. Using a saline nasal spray works much the same way. Cough drops, hard candies and sore throat lozenges may ease your cough. Line up a caregiver. Have someone check on you regularly.   GET HELP RIGHT AWAY IF: You cannot keep down liquids or your medications. You become short of breath Your fell like you are going to pass out or loose consciousness. Your symptoms persist after you have completed your treatment plan MAKE SURE YOU  Understand these instructions. Will watch your condition. Will get help right away if you are not doing well or get worse.  Your e-visit answers were reviewed by a board certified advanced clinical practitioner to complete your personal care plan.  Depending on the condition, your plan could have included both over the counter or prescription medications.  If there is a problem please reply  once you have received a response from your provider.  Your safety is important to [place].  If you have drug allergies check your prescription carefully.    You can use MyChart to ask questions about today's visit, request a non-urgent call back, or ask for a work or school excuse for 24 hours related to this e-Visit. If it has been greater than 24 hours you will need to follow up with your provider, or enter a new e-Visit to address  those concerns.  You will get an e-mail in the next two days asking about your experience.  I hope that your e-visit has been valuable and will speed your recovery. Thank you for using e-visits.   I have spent 5 minutes in review of e-visit questionnaire, review and updating patient chart, medical decision making and response to patient.   Margaretann Loveless, PA-C

## 2023-12-14 ENCOUNTER — Encounter: Payer: Self-pay | Admitting: Family Medicine

## 2023-12-14 DIAGNOSIS — R739 Hyperglycemia, unspecified: Secondary | ICD-10-CM

## 2023-12-16 ENCOUNTER — Ambulatory Visit: Payer: PRIVATE HEALTH INSURANCE | Admitting: Family Medicine

## 2023-12-16 ENCOUNTER — Other Ambulatory Visit (INDEPENDENT_AMBULATORY_CARE_PROVIDER_SITE_OTHER): Payer: Self-pay

## 2023-12-16 ENCOUNTER — Encounter: Payer: Self-pay | Admitting: Family Medicine

## 2023-12-16 VITALS — BP 130/78 | HR 81 | Temp 97.8°F | Ht 69.5 in | Wt 204.0 lb

## 2023-12-16 DIAGNOSIS — R361 Hematospermia: Secondary | ICD-10-CM | POA: Insufficient documentation

## 2023-12-16 DIAGNOSIS — R739 Hyperglycemia, unspecified: Secondary | ICD-10-CM | POA: Diagnosis not present

## 2023-12-16 DIAGNOSIS — R319 Hematuria, unspecified: Secondary | ICD-10-CM | POA: Insufficient documentation

## 2023-12-16 DIAGNOSIS — R310 Gross hematuria: Secondary | ICD-10-CM | POA: Insufficient documentation

## 2023-12-16 LAB — COMPREHENSIVE METABOLIC PANEL WITH GFR
ALT: 26 U/L (ref 0–53)
AST: 17 U/L (ref 0–37)
Albumin: 4.9 g/dL (ref 3.5–5.2)
Alkaline Phosphatase: 60 U/L (ref 39–117)
BUN: 10 mg/dL (ref 6–23)
CO2: 28 meq/L (ref 19–32)
Calcium: 9.7 mg/dL (ref 8.4–10.5)
Chloride: 104 meq/L (ref 96–112)
Creatinine, Ser: 0.81 mg/dL (ref 0.40–1.50)
GFR: 104.54 mL/min (ref >60.00)
Glucose, Bld: 89 mg/dL (ref 70–99)
Potassium: 3.8 meq/L (ref 3.5–5.1)
Sodium: 141 meq/L (ref 135–145)
Total Bilirubin: 1 mg/dL (ref 0.2–1.2)
Total Protein: 7 g/dL (ref 6.0–8.3)

## 2023-12-16 LAB — POC URINALSYSI DIPSTICK (AUTOMATED)
Bilirubin, UA: NEGATIVE
Blood, UA: NEGATIVE
Glucose, UA: NEGATIVE
Ketones, UA: NEGATIVE
Leukocytes, UA: NEGATIVE
Nitrite, UA: NEGATIVE
Protein, UA: NEGATIVE
Spec Grav, UA: 1.01 (ref 1.010–1.025)
Urobilinogen, UA: 0.2 U/dL
pH, UA: 7 (ref 5.0–8.0)

## 2023-12-16 LAB — LIPID PANEL
Cholesterol: 205 mg/dL — ABNORMAL HIGH (ref 0–200)
HDL: 57.1 mg/dL (ref >39.00)
LDL Cholesterol: 128 mg/dL — ABNORMAL HIGH (ref 0–99)
NonHDL: 148.15
Total CHOL/HDL Ratio: 4
Triglycerides: 102 mg/dL (ref 0.0–149.0)
VLDL: 20.4 mg/dL (ref 0.0–40.0)

## 2023-12-16 NOTE — Addendum Note (Signed)
 Addended by: RILLA BALLER on: 12/16/2023 08:51 AM   Modules accepted: Orders

## 2023-12-16 NOTE — Assessment & Plan Note (Signed)
 This happened 1-2 months ago for period of 2 weeks and went away  No history of trauma and no history of testicular or prostate discomfort   Now having some urinary gross blood (small amt) on and off Urinalysis clear  Prostate exam reassuring  Referred to urology

## 2023-12-16 NOTE — Progress Notes (Signed)
 Subjective:    Patient ID: Benjamin Norman, male    DOB: [birth year], 48 y.o.   MRN: [id number]  HPI  Wt Readings from Last 3 Encounters:  12/16/23 204 lb (92.5 kg)  11/20/22 217 lb 8 oz (98.7 kg)  11/12/22 218 lb 4 oz (99 kg)   29.69 kg/m  Vitals:   12/16/23 1139  BP: 130/78  Pulse: 81  Temp: 97.8 F (36.6 C)  SpO2: 97%    48 yo pt of Dr KANDICE presents for c/o blood in urine and semen    Blood in semen a month ago   Blood in urine now    Tuesday of last week started with a small blood clot followed by small amt of blood in urine  Off /on all day  None on wed  Thursday started back  Yesterday -once Nothing today   Perhaps a small amount of burning to urinate (unsure about this)   No lumps in testicle area / does not usually check  No discomfort   ? If urination has been more frequent lately No nocturia    1-2 months ago had some blood in semen -for about 2 weeks   No trauma known  His sons rough house a bit   No urologic family history that he knows of   No blood thinners  No asa  No nsaids      Lab Results  Component Value Date   PSA 1.24 10/02/2021    Urologic history  No history of uti or kidney stones or prostate problems    UA: Clear  Results for orders placed or performed in visit on 12/16/23  POCT Urinalysis Dipstick (Automated)   Collection Time: 12/16/23 12:10 PM  Result Value Ref Range   Color, UA Yellow    Clarity, UA Clear    Glucose, UA Negative Negative   Bilirubin, UA Negative    Ketones, UA Negative    Spec Grav, UA 1.010 1.010 - 1.025   Blood, UA Negative    pH, UA 7.0 5.0 - 8.0   Protein, UA Negative Negative   Urobilinogen, UA 0.2 0.2 or 1.0 E.U./dL   Nitrite, UA Negative    Leukocytes, UA Negative Negative          Patient Active Problem List   Diagnosis Date Noted   Blood in urine 12/16/2023   Blood in semen 12/16/2023   Hyperglycemia 09/17/2022   Chronic pain of left knee 12/04/2021   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   SVT (supraventricular tachycardia) (HCC) 08/19/2019   Obesity, Class I, BMI 30-34.9 08/19/2019   Trochlear nerve palsy, right eye    Healthcare maintenance 02/09/2011   NEVUS, ATYPICAL 02/02/2010   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   Past Medical History:  Diagnosis Date   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    intermittent   Childhood asthma    COVID-19 09/02/2019   COVID-19 virus infection 08/2019   Fever blister    Heart murmur    as child   Other kyphosis (acquired)    thoracic   SVT (supraventricular tachycardia) (HCC)    AV re entry Robyne)   Trochlear nerve palsy, right eye    congenital Teacher, English as a foreign language)   Unspecified tinnitus    Past Surgical History:  Procedure Laterality Date   MOHS SURGERY     Social History   Tobacco Use   Smoking status: Never   Smokeless tobacco: Never  Vaping Use   Vaping status: Never Used  Substance  Use Topics   Alcohol use: No   Drug use: No   Family History  Problem Relation Age of Onset   Hypertension Mother    Hyperlipidemia Mother    Healthy Father    Healthy Brother    Coronary artery disease Paternal Grandfather    Alcohol abuse Paternal Grandfather    Heart attack Paternal Grandfather    Cancer Neg Hx    Stroke Neg Hx    Diabetes Neg Hx    Allergies  Allergen Reactions   Theophyllines Nausea And Vomiting    As teen   Current Outpatient Medications on File Prior to Visit  Medication Sig Dispense Refill   5-Hydroxytryptophan (5-HTP) 200 MG TABS Take 1 tablet by mouth daily.     GLUCOSAMINE CHONDROITIN MSM PO Take 40-50 mg by mouth daily.     levocetirizine (XYZAL) 5 MG tablet Take 5 mg by mouth daily.     Multiple Vitamin (MULTIVITAMINS PO) Take by mouth daily.     Omega-3 1400 MG CAPS Take 2 capsules by mouth daily.     OVER THE COUNTER MEDICATION Modere Immune: Zinc 7.5/Elderberry 95 mg 1 teaspoon by mouth daily     No current facility-administered medications on file prior to visit.     Review of Systems     Objective:   Physical Exam Exam conducted with a chaperone present.  Constitutional:      General: He is not in acute distress.    Appearance: Normal appearance. He is normal weight. He is not ill-appearing.  Cardiovascular:     Rate and Rhythm: Normal rate.  Pulmonary:     Effort: Pulmonary effort is normal. No respiratory distress.  Abdominal:     General: Abdomen is flat. Bowel sounds are normal. There is no distension.     Palpations: There is no mass.     Tenderness: There is no abdominal tenderness. There is no right CVA tenderness, left CVA tenderness, guarding or rebound.     Comments: No suprapubic tenderness or fullness    Genitourinary:    Prostate: Not tender and no nodules present.     Rectum: No mass. Normal anal tone.     Comments: Prostate is firm /symmetric/ non tender  Mildly enlarged vs large-normal  Skin:    Coloration: Skin is not pale.     Findings: No erythema or rash.  Neurological:     Mental Status: He is alert.  Psychiatric:        Mood and Affect: Mood normal.           Assessment & Plan:   Problem List Items Addressed This Visit       Other   Blood in urine - Primary   Intermittent/ gross / small amt  Had blood in semen several mo ago as well  No other symptoms  Reassuring exam (may have mild BPH) , no signs of prostate or urine infection  Urinalysis clear Urine culture pending   Referral made to urology for further eval /treatment  Encouraged to stay hydrated Update if not starting to improve in a week or if worsening  Call back and Er precautions noted in detail today        Relevant Orders   POCT Urinalysis Dipstick (Automated) (Completed)   Urine Culture   Ambulatory referral to Urology   Blood in semen   This happened 1-2 months ago for period of 2 weeks and went away  No history of trauma and no history  of testicular or prostate discomfort   Now having some urinary gross blood (small amt)  on and off Urinalysis clear  Prostate exam reassuring  Referred to urology      Relevant Orders   Ambulatory referral to Urology

## 2023-12-16 NOTE — Telephone Encounter (Signed)
 Spoke with pt scheduling fasting lab visit today at 2:30. Also, spoke with pt scheduling OV today at 12:00 with Dr Randeen for urine sxs.

## 2023-12-16 NOTE — Patient Instructions (Signed)
 I put the referral in for urology  Please let us  know if you don't hear in 1-2 weeks to set that up  Urine culture is pending   If symptoms worsen in the meantime please let us  know   Stay hydrated

## 2023-12-16 NOTE — Assessment & Plan Note (Signed)
 Intermittent/ gross / small amt  Had blood in semen several mo ago as well  No other symptoms  Reassuring exam (may have mild BPH) , no signs of prostate or urine infection  Urinalysis clear Urine culture pending   Referral made to urology for further eval /treatment  Encouraged to stay hydrated Update if not starting to improve in a week or if worsening  Call back and Er precautions noted in detail today

## 2023-12-16 NOTE — Telephone Encounter (Signed)
 Labs ordered.

## 2023-12-17 ENCOUNTER — Ambulatory Visit: Payer: Self-pay | Admitting: Family Medicine

## 2023-12-17 LAB — URINE CULTURE
MICRO NUMBER:: 16694929
Result:: NO GROWTH
SPECIMEN QUALITY:: ADEQUATE

## 2023-12-18 ENCOUNTER — Encounter: Payer: Self-pay | Admitting: Family Medicine

## 2023-12-18 ENCOUNTER — Ambulatory Visit (INDEPENDENT_AMBULATORY_CARE_PROVIDER_SITE_OTHER): Payer: PRIVATE HEALTH INSURANCE | Admitting: Family Medicine

## 2023-12-18 VITALS — BP 122/84 | HR 62 | Temp 97.8°F | Ht 69.5 in | Wt 206.2 lb

## 2023-12-18 DIAGNOSIS — Z0001 Encounter for general adult medical examination with abnormal findings: Secondary | ICD-10-CM | POA: Diagnosis not present

## 2023-12-18 DIAGNOSIS — R361 Hematospermia: Secondary | ICD-10-CM | POA: Diagnosis not present

## 2023-12-18 DIAGNOSIS — Z1211 Encounter for screening for malignant neoplasm of colon: Secondary | ICD-10-CM | POA: Diagnosis not present

## 2023-12-18 DIAGNOSIS — M40204 Unspecified kyphosis, thoracic region: Secondary | ICD-10-CM

## 2023-12-18 DIAGNOSIS — R310 Gross hematuria: Secondary | ICD-10-CM

## 2023-12-18 DIAGNOSIS — E66811 Obesity, class 1: Secondary | ICD-10-CM

## 2023-12-18 NOTE — Progress Notes (Signed)
 Ph: [phone] Fax: [phone]   Patient ID: Benjamin Norman, male    DOB: 09-04-1975, 48 y.o.   MRN: [id number]  This visit was conducted in person.  BP 122/84   Pulse 62   Temp 97.8 F (36.6 C) (Oral)   Ht 5' 9.5 (1.765 m)   Wt 206 lb 4 oz (93.6 kg)   SpO2 97%   BMI 30.02 kg/m    CC: CPE Subjective:   HPI: Benjamin Norman is a 48 y.o. male presenting on 12/18/2023 for Annual Exam   Increased stress at work  Working on weight loss - ~18 lbs at home. Calorie counting.   Saw Dr Addie for L knee loose body s/p knee arthroscopy surgery last year.   Episode of blood in urine and semen - has been referred to urology for further evaluation.   H/o AV re entry SVT previously prescribed metoprolol  PRN but doesn't use this. Episodes stable in frequency, occur about twice a year. Last saw cardiology 2017.    Known congenital CN 4 palsy (right).   Preventative: Colon cancer screening - yearly iFOB latest neg 2024 Flu shot yearly at work  COVID vaccine - declined Td 2009. Tdap 08/2019  Seat belt use discussed  Sunscreen use discussed. Sees dermatologist yearly for h/o atypical nevi.  Sleep - averaging 7-8 hours/night.  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 Interim medical history since our last visit reviewed. Allergies and medications reviewed and updated. Outpatient Medications Prior to Visit  Medication Sig Dispense Refill   5-Hydroxytryptophan (5-HTP) 200 MG TABS Take 1 tablet by mouth daily.     GLUCOSAMINE CHONDROITIN MSM PO Take 40-50 mg by mouth daily.     levocetirizine (XYZAL) 5 MG tablet Take 5 mg by mouth  daily.     Multiple Vitamin (MULTIVITAMINS PO) Take by mouth daily.     Omega-3 1400 MG CAPS Take 2 capsules by mouth daily.     OVER THE COUNTER MEDICATION Modere Immune: Zinc 7.5/Elderberry 95 mg 1 teaspoon by mouth daily     No facility-administered medications prior to visit.     Per HPI unless specifically indicated in ROS section below Review of Systems  Constitutional:  Negative for activity change, appetite change, chills, fatigue, fever and unexpected weight change.  HENT:  Negative for hearing loss.   Eyes:  Negative for visual disturbance.  Respiratory:  Negative for cough, chest tightness, shortness of breath and wheezing.   Cardiovascular:  Negative for chest pain, palpitations and leg swelling.  Gastrointestinal:  Negative for abdominal distention, abdominal pain, blood in stool, constipation, diarrhea, nausea and vomiting.  Genitourinary:  Positive for hematuria (x1). Negative for difficulty urinating.  Musculoskeletal:  Negative for arthralgias, myalgias and neck pain.  Skin:  Negative for rash.  Neurological:  Positive for headaches (occ). Negative for dizziness, seizures and syncope.  Hematological:  Negative for adenopathy. Does not bruise/bleed easily.  Psychiatric/Behavioral:  Negative for dysphoric mood. The patient is not nervous/anxious.     Objective:  BP 122/84   Pulse 62   Temp 97.8 F (36.6 C) (Oral)   Ht 5' 9.5 (  1.765 m)   Wt 206 lb 4 oz (93.6 kg)   SpO2 97%   BMI 30.02 kg/m   Wt Readings from Last 3 Encounters:  12/18/23 206 lb 4 oz (93.6 kg)  12/16/23 204 lb (92.5 kg)  11/20/22 217 lb 8 oz (98.7 kg)   Ht Readings from Last 3 Encounters:  12/18/23 5' 9.5 (1.765 m)  12/16/23 5' 9.5 (1.765 m)  11/20/22 5' 9.5 (1.765 m)      Physical Exam Vitals and nursing note reviewed.  Constitutional:      General: He is not in acute distress.    Appearance: Normal appearance. He is well-developed. He is not ill-appearing.  HENT:     Head:  Normocephalic and atraumatic.     Right Ear: Hearing, tympanic membrane, ear canal and external ear normal.     Left Ear: Hearing, tympanic membrane, ear canal and external ear normal.     Mouth/Throat:     Mouth: Mucous membranes are moist.     Pharynx: Oropharynx is clear. No oropharyngeal exudate or posterior oropharyngeal erythema.  Eyes:     General: No scleral icterus.    Extraocular Movements: Extraocular movements intact.     Conjunctiva/sclera: Conjunctivae normal.     Pupils: Pupils are equal, round, and reactive to light.  Neck:     Thyroid : No thyroid  mass or thyromegaly.  Cardiovascular:     Rate and Rhythm: Normal rate and regular rhythm.     Pulses: Normal pulses.          Radial pulses are 2+ on the right side and 2+ on the left side.     Heart sounds: Normal heart sounds. No murmur heard. Pulmonary:     Effort: Pulmonary effort is normal. No respiratory distress.     Breath sounds: Normal breath sounds. No wheezing, rhonchi or rales.  Abdominal:     General: Bowel sounds are normal. There is no distension.     Palpations: Abdomen is soft. There is no mass.     Tenderness: There is no abdominal tenderness. There is no guarding or rebound.     Hernia: No hernia is present.  Musculoskeletal:        General: Normal range of motion.     Cervical back: Normal range of motion and neck supple.     Right lower leg: No edema.     Left lower leg: No edema.     Comments: Thoracic kyphosis present  Lymphadenopathy:     Cervical: No cervical adenopathy.  Skin:    General: Skin is warm and dry.     Findings: No rash.  Neurological:     General: No focal deficit present.     Mental Status: He is alert and oriented to person, place, and time.  Psychiatric:        Mood and Affect: Mood normal.        Behavior: Behavior normal.        Thought Content: Thought content normal.        Judgment: Judgment normal.       Results for orders placed or performed in visit on  12/16/23  Comprehensive metabolic panel with GFR   Collection Time: 12/16/23 11:43 AM  Result Value Ref Range   Sodium 141 135 - 145 mEq/L   Potassium 3.8 3.5 - 5.1 mEq/L   Chloride 104 96 - 112 mEq/L   CO2 28 19 - 32 mEq/L   Glucose, Bld 89 70 - 99 mg/dL  BUN 10 6 - 23 mg/dL   Creatinine, Ser 9.18 0.40 - 1.50 mg/dL   Total Bilirubin 1.0 0.2 - 1.2 mg/dL   Alkaline Phosphatase 60 39 - 117 U/L   AST 17 0 - 37 U/L   ALT 26 0 - 53 U/L   Total Protein 7.0 6.0 - 8.3 g/dL   Albumin 4.9 3.5 - 5.2 g/dL   GFR 895.45 >39.99 mL/min   Calcium 9.7 8.4 - 10.5 mg/dL  Lipid panel   Collection Time: 12/16/23 11:43 AM  Result Value Ref Range   Cholesterol 205 (H) 0 - 200 mg/dL   Triglycerides 897.9 0.0 - 149.0 mg/dL   HDL 42.89 >60.99 mg/dL   VLDL 79.5 0.0 - 59.9 mg/dL   LDL Cholesterol 871 (H) 0 - 99 mg/dL   Total CHOL/HDL Ratio 4    NonHDL 148.15    Lab Results  Component Value Date   PSA 1.24 10/02/2021     Assessment & Plan:   Problem List Items Addressed This Visit     Encounter for general adult medical examination with abnormal findings - Primary (Chronic)   Preventative protocols reviewed and updated unless pt declined. Discussed healthy diet and lifestyle.       Kyphosis of thoracic region   Discussed back strengthening exercises.       Obesity, Class I, BMI 30-34.9   Congratulated on weight loss to date  Pt is motivated to continue healthy diet and lifestyle changes.       Gross hematuria   [facility]ending [facility] evaluation.       Blood in semen   Relevant Orders   PSA   Other Visit Diagnoses       Special screening for malignant neoplasms, colon       Relevant Orders   Fecal occult blood, imunochemical        No orders of the defined types were placed in this encounter.   Orders Placed This Encounter  Procedures   Fecal occult blood, imunochemical    Standing Status:   Future    Expiration Date:   12/17/2024   PSA    Patient Instructions   Pass by lab to pick up stool kit  You are doing well today Return as needed or in 1 year for next physical Will await urology evaluation  Follow up plan: No follow-ups on file.  Anton Blas, MD

## 2023-12-18 NOTE — Assessment & Plan Note (Signed)
 [facility]ending [facility] evaluation.

## 2023-12-18 NOTE — Patient Instructions (Addendum)
 Pass by lab to pick up stool kit  You are doing well today Return as needed or in 1 year for next physical Will await urology evaluation

## 2023-12-18 NOTE — Assessment & Plan Note (Signed)
 Congratulated on weight loss to date  Pt is motivated to continue healthy diet and lifestyle changes.

## 2023-12-18 NOTE — Assessment & Plan Note (Signed)
 Discussed back strengthening exercises.

## 2023-12-18 NOTE — Assessment & Plan Note (Signed)
 Preventative protocols reviewed and updated unless pt declined. Discussed healthy diet and lifestyle.

## 2023-12-19 ENCOUNTER — Ambulatory Visit: Payer: Self-pay | Admitting: Family Medicine

## 2023-12-19 LAB — PSA: PSA: 1.46 ng/mL (ref 0.10–4.00)

## 2023-12-23 ENCOUNTER — Other Ambulatory Visit (INDEPENDENT_AMBULATORY_CARE_PROVIDER_SITE_OTHER): Payer: PRIVATE HEALTH INSURANCE | Admitting: Radiology

## 2023-12-23 DIAGNOSIS — Z1211 Encounter for screening for malignant neoplasm of colon: Secondary | ICD-10-CM

## 2023-12-25 LAB — FECAL OCCULT BLOOD, IMMUNOCHEMICAL: Fecal Occult Bld: NEGATIVE

## 2024-04-06 ENCOUNTER — Encounter: Payer: Self-pay | Admitting: Radiology
